# Patient Record
Sex: Male | Born: 1975 | Hispanic: No | Marital: Single | Smoking: Never smoker
Health system: Southern US, Community
[De-identification: ages and names within clinical notes are randomized; demographics above are authoritative.]

## PROBLEM LIST (undated history)

## (undated) DIAGNOSIS — G809 Cerebral palsy, unspecified: Secondary | ICD-10-CM

## (undated) DIAGNOSIS — H9190 Unspecified hearing loss, unspecified ear: Secondary | ICD-10-CM

## (undated) DIAGNOSIS — R569 Unspecified convulsions: Secondary | ICD-10-CM

## (undated) HISTORY — DX: Unspecified hearing loss, unspecified ear: H91.90

## (undated) HISTORY — DX: Unspecified convulsions: R56.9

## (undated) HISTORY — DX: Cerebral palsy, unspecified: G80.9

---

## 2002-07-15 ENCOUNTER — Emergency Department (HOSPITAL_COMMUNITY): Admission: EM | Admit: 2002-07-15 | Discharge: 2002-07-15 | Payer: Self-pay | Admitting: *Deleted

## 2005-05-20 ENCOUNTER — Emergency Department (HOSPITAL_COMMUNITY): Admission: EM | Admit: 2005-05-20 | Discharge: 2005-05-20 | Payer: Self-pay | Admitting: Emergency Medicine

## 2010-10-14 ENCOUNTER — Emergency Department (HOSPITAL_COMMUNITY): Admission: EM | Admit: 2010-10-14 | Discharge: 2010-10-14 | Payer: Self-pay | Admitting: Emergency Medicine

## 2013-03-30 ENCOUNTER — Ambulatory Visit (INDEPENDENT_AMBULATORY_CARE_PROVIDER_SITE_OTHER): Payer: BC Managed Care – PPO | Admitting: Nurse Practitioner

## 2013-03-30 ENCOUNTER — Encounter: Payer: Self-pay | Admitting: Nurse Practitioner

## 2013-03-30 VITALS — BP 116/75 | HR 62 | Ht 69.0 in | Wt 179.0 lb

## 2013-03-30 DIAGNOSIS — Z79899 Other long term (current) drug therapy: Secondary | ICD-10-CM | POA: Insufficient documentation

## 2013-03-30 DIAGNOSIS — G40209 Localization-related (focal) (partial) symptomatic epilepsy and epileptic syndromes with complex partial seizures, not intractable, without status epilepticus: Secondary | ICD-10-CM

## 2013-03-30 MED ORDER — CARBAMAZEPINE 200 MG PO TABS: 200.0000 mg | ORAL_TABLET | Freq: Three times a day (TID) | ORAL | Status: DC

## 2013-03-30 NOTE — Patient Instructions (Addendum)
Continue medication at current dose, will renew prescription today Get labs, CBC, CMP, carbamazepine level Followup yearly and when necessary

## 2013-03-30 NOTE — Progress Notes (Signed)
HPI:  Patient returns for a yearly followup. Last visit 03/31/2012 . History of complex partial seizure disorder. He has been followed by our practice since December of 1997. He has history of spells inconsistent staring that represents his complex partial seizure disorder. He is the product of a complicated second pregnancy and was born prematurely. 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 He is currently on carbamazepine 200 mg 3 times daily without side effects. He denies any feelings of being off balance or falls Date of last seizure 1997. No staring spells, confusion, sleep disturbances, lapses of time, headache and bowel and bladder incontinence.    ROS:  -  seizure  Physical Exam General: well developed, well nourished, seated, in no evident distress Head: head normocephalic and atraumatic. Oropharynx benign Neck: supple with no carotid or supraclavicular bruits Cardiovascular: regular rate and rhythm, no murmurs  Neurologic Exam Mental Status: Awake and fully alert. Follows one 2 and 3 step commands   Cranial Nerves:Pupils equal, briskly reactive to light. Extraocular movements full without nystagmus. Visual fields full to confrontation. Hearing intact and symmetric to finger snap. Facial sensation intact. Face, tongue, palate move normally and symmetrically. Neck flexion and extension normal.  Motor: Normal bulk and tone. Normal strength in all tested extremity muscles. Coordination: Rapid alternating movements normal in all extremities. Finger-to-nose and heel-to-shin performed accurately bilaterally. Gait and Station: Arises from chair without difficulty. Hemiparetic gait on the right no assistive device. Unsteady with tandem, able to walk on heels and toes.  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

## 2013-03-31 ENCOUNTER — Encounter: Payer: Self-pay | Admitting: Nurse Practitioner

## 2013-03-31 LAB — COMPREHENSIVE METABOLIC PANEL
ALT: 31 IU/L (ref 0–44)
AST: 27 IU/L (ref 0–40)
Albumin/Globulin Ratio: 1.7 (ref 1.1–2.5)
BUN/Creatinine Ratio: 17 (ref 8–19)
BUN: 21 mg/dL — ABNORMAL HIGH (ref 6–20)
CO2: 28 mmol/L (ref 19–28)
Globulin, Total: 2.7 g/dL (ref 1.5–4.5)
Potassium: 4.8 mmol/L (ref 3.5–5.2)
Sodium: 144 mmol/L (ref 134–144)

## 2013-03-31 LAB — CARBAMAZEPINE LEVEL, TOTAL: Carbamazepine Lvl: 6 ug/mL (ref 4.0–12.0)

## 2013-03-31 LAB — CBC WITH DIFFERENTIAL/PLATELET
Basophils Absolute: 0 10*3/uL (ref 0.0–0.2)
Basos: 0 % (ref 0–3)
Eos: 2 % (ref 0–5)
Eosinophils Absolute: 0.1 10*3/uL (ref 0.0–0.4)
Lymphs: 45 % (ref 14–46)
MCH: 29.3 pg (ref 26.6–33.0)
MCHC: 34.2 g/dL (ref 31.5–35.7)
MCV: 86 fL (ref 79–97)
RBC: 5.05 x10E6/uL (ref 4.14–5.80)
RDW: 12.9 % (ref 12.3–15.4)

## 2014-03-08 ENCOUNTER — Telehealth: Payer: Self-pay | Admitting: Nurse Practitioner

## 2014-03-08 MED ORDER — CARBAMAZEPINE 200 MG PO TABS: 200.0000 mg | ORAL_TABLET | Freq: Three times a day (TID) | ORAL | Status: DC

## 2014-03-08 NOTE — Telephone Encounter (Signed)
Pt called for his refill prescription on carbamazepine (EPITOL) 200 MG tablet. When calling back to confirm of refill complete please let first operator know that this has been called into pharmacy. Thanks

## 2014-03-08 NOTE — Telephone Encounter (Signed)
Rx was sent.  I called back.  The operator said there was no answer from the patient and they took the message Rx has been sent.

## 2014-03-13 ENCOUNTER — Other Ambulatory Visit: Payer: Self-pay | Admitting: Nurse Practitioner

## 2014-03-29 ENCOUNTER — Ambulatory Visit (INDEPENDENT_AMBULATORY_CARE_PROVIDER_SITE_OTHER): Payer: BC Managed Care – PPO | Admitting: Nurse Practitioner

## 2014-03-29 ENCOUNTER — Encounter (INDEPENDENT_AMBULATORY_CARE_PROVIDER_SITE_OTHER): Payer: Self-pay

## 2014-03-29 ENCOUNTER — Encounter: Payer: Self-pay | Admitting: Nurse Practitioner

## 2014-03-29 VITALS — BP 122/81 | HR 78 | Ht 69.0 in | Wt 149.0 lb

## 2014-03-29 DIAGNOSIS — H9190 Unspecified hearing loss, unspecified ear: Secondary | ICD-10-CM | POA: Insufficient documentation

## 2014-03-29 DIAGNOSIS — G40209 Localization-related (focal) (partial) symptomatic epilepsy and epileptic syndromes with complex partial seizures, not intractable, without status epilepticus: Secondary | ICD-10-CM

## 2014-03-29 DIAGNOSIS — Z79899 Other long term (current) drug therapy: Secondary | ICD-10-CM

## 2014-03-29 LAB — COMPREHENSIVE METABOLIC PANEL
A/G RATIO: 1.7 (ref 1.1–2.5)
ALBUMIN: 4.5 g/dL (ref 3.5–5.5)
ALK PHOS: 36 IU/L — AB (ref 39–117)
ALT: 30 IU/L (ref 0–44)
AST: 27 IU/L (ref 0–40)
BILIRUBIN TOTAL: 0.4 mg/dL (ref 0.0–1.2)
BUN/Creatinine Ratio: 19 (ref 8–19)
BUN: 22 mg/dL — ABNORMAL HIGH (ref 6–20)
CO2: 25 mmol/L (ref 18–29)
CREATININE: 1.17 mg/dL (ref 0.76–1.27)
Calcium: 9.4 mg/dL (ref 8.7–10.2)
Chloride: 105 mmol/L (ref 96–108)
GFR calc Af Amer: 93 mL/min/{1.73_m2} (ref >59)
GFR calc non Af Amer: 80 mL/min/{1.73_m2} (ref >59)
GLUCOSE: 103 mg/dL — AB (ref 65–99)
Globulin, Total: 2.6 g/dL (ref 1.5–4.5)
Potassium: 4.6 mmol/L (ref 3.5–5.2)
Sodium: 141 mmol/L (ref 134–144)
TOTAL PROTEIN: 7.1 g/dL (ref 6.0–8.5)

## 2014-03-29 LAB — CBC WITH DIFFERENTIAL/PLATELET
BASOS ABS: 0 10*3/uL (ref 0.0–0.2)
BASOS: 1 %
Eos: 2 %
Eosinophils Absolute: 0.1 10*3/uL (ref 0.0–0.4)
HCT: 39.4 % (ref 37.5–51.0)
Hemoglobin: 13.7 g/dL (ref 12.6–17.7)
LYMPHS ABS: 2.6 10*3/uL (ref 0.7–3.1)
Lymphs: 45 %
MCH: 29.5 pg (ref 26.6–33.0)
MCHC: 34.8 g/dL (ref 31.5–35.7)
MCV: 85 fL (ref 79–97)
MONOCYTES: 7 %
MONOS ABS: 0.4 10*3/uL (ref 0.1–0.9)
NEUTROS ABS: 2.7 10*3/uL (ref 1.4–7.0)
Neutrophils Relative %: 45 %
RBC: 4.65 x10E6/uL (ref 4.14–5.80)
RDW: 12.8 % (ref 12.3–15.4)
WBC: 5.7 10*3/uL (ref 3.4–10.8)

## 2014-03-29 LAB — CARBAMAZEPINE LEVEL, TOTAL: CARBAMAZEPINE LVL: 6.3 ug/mL (ref 4.0–12.0)

## 2014-03-29 MED ORDER — CARBAMAZEPINE 200 MG PO TABS: 200.0000 mg | ORAL_TABLET | Freq: Three times a day (TID) | ORAL | Status: DC

## 2014-03-29 NOTE — Patient Instructions (Signed)
Continue Carbamazepine at  current dose will refill We will check labs today along with level of the drug Followup yearly and prn

## 2014-03-29 NOTE — Progress Notes (Signed)
[facility]  PATIENT: Mitchell Jensen DOB: 1976/01/12   REASON FOR VISIT: follow up for seizure disorder   HISTORY OF PRESENT ILLNESS: Mitchell Jensen, 38 year old returns for follow up for seizure disorder, he  has been well-controlled on carbamazepine with last seizure occurring in 1997. 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 He is deaf and has an interpreter for signing purposes however she does  not understand some of the questions that I am  asking and she called for backup assistance. A second interpreter  showed up and the visit was completed. He denies headache, bowel or bladder incontinence, staring spells. He does complain with some confusion at times, he denies that he has any difficulty sleeping. He denies missing any doses of his carbamazepine. He does not have a primary care provider .He returns for reevaluation.  HISTORY: of complex partial seizure disorder. He has been followed by our practice since December of 1997. He has history of spells inconsistent staring that represents his complex partial seizure disorder. He is the product of a complicated second pregnancy and was born prematurely. 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 He is currently on carbamazepine 200 mg 3 times daily without side effects. He denies any feelings of being off balance or falls Date of last seizure 1997. No staring spells, confusion, sleep disturbances, lapses of time, headache and bowel and bladder incontinence.    REVIEW OF SYSTEMS: Full 14 system review of systems performed and notable only for those listed, all others are neg:  Constitutional: N/A  Cardiovascular: N/A    Ear/Nose/Throat: deaf Skin: N/A  Eyes: N/A  Respiratory: N/A  Gastroitestinal: N/A  Hematology/Lymphatic: N/A  Endocrine: N/A Musculoskeletal:N/A  Allergy/Immunology: N/A  Neurological: History of seizure disorder  Psychiatric: N/A   ALLERGIES: No Known Allergies  HOME MEDICATIONS: Outpatient Prescriptions Prior to Visit  Medication Sig Dispense Refill  . carbamazepine (EPITOL) 200 MG tablet Take 1 tablet (200 mg total) by mouth 3 (three) times daily.  90 tablet  0  . EPITOL 200 MG tablet TAKE ONE TABLET BY MOUTH THREE TIMES DAILY  90 tablet  0   No facility-administered medications prior to visit.    PAST MEDICAL HISTORY: Past Medical History  Diagnosis Date  . Seizures   . 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 Cerebral palsy DEAF since birth     PAST SURGICAL HISTORY: History reviewed. No pertinent past surgical history.  FAMILY HISTORY: Family History  Problem Relation Age of Onset  . Heart attack Father   . COPD Father     SOCIAL HISTORY: History   Social History  . Marital Status: Single    Spouse Name: N/A    Number of Children: 0  . 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 Smoking status: Never Smoker   . Smokeless tobacco: Never Used  . Alcohol Use: No  . Drug Use: No  . Sexual Activity: Not on file   Other Topics Concern  . 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   Patient lives at home alone.    Patient has a HS.    Patient is left handed.    Patient drinks caffeine every once in a while. And sometimes if he drinks caffeine while working.       PHYSICAL EXAM  Filed Vitals:   03/29/14 1004  BP: 122/81  Pulse: 78  Height: 5\' 9"  (1.753 m)  Weight: 149 lb (67.586 kg)   Body mass index is 21.99 kg/(m^2).  Generalized: Well developed, in no acute distress well groomed,  Head:  normocephalic and atraumatic,. Oropharynx benign  Neck: Supple, no carotid bruits  Cardiac: Regular rate rhythm, no murmur  Neurological examination   Mentation: Alert.   Follows all commands given  by an interpretor that signs,  patient is deaf.   Cranial nerve II-XII: Pupils were equal round reactive to light extraocular movements were full, visual field were full on confrontational test. Facial sensation and strength were normal. Patient DEAF.  Uvula tongue midline. head turning and shoulder shrug were normal and symmetric.Tongue protrusion into cheek strength was normal. Motor: normal bulk and tone, full strength in the BUE, BLE, mild decreased right grip strength  No focal weakness Sensory: normal and symmetric to light touch, pinprick, and  vibration  Coordination: finger-nose-finger, heel-to-shin bilaterally, no dysmetria Reflexes: Increased on the right as compared to the left plantar responses were flexor bilaterally. Gait and Station: Rising up from seated position without assistance, hemiparetic gait on the right without assistive device. Unsteady with tandem DIAGNOSTIC DATA (LABS, IMAGING, TESTING) - I reviewed patient records, labs, notes, testing and imaging myself where available.  Lab Results  Component Value Date   WBC 5.4 03/30/2013   HGB 14.8 03/30/2013   HCT 43.3 03/30/2013   MCV 86 03/30/2013      Component Value Date/Time   NA 144 03/30/2013 0951   K 4.8 03/30/2013 0951   CL 104 03/30/2013 0951   CO2 28 03/30/2013 0951   GLUCOSE 89 03/30/2013 0951   BUN 21* 03/30/2013 0951   CREATININE 1.26 03/30/2013 0951   CALCIUM 9.6 03/30/2013 0951   PROT 7.3 03/30/2013 0951   AST 27 03/30/2013 0951   ALT 31 03/30/2013 0951   ALKPHOS 40 03/30/2013 0951   BILITOT 0.5 03/30/2013 0951   GFRNONAA 74 03/30/2013 0951   GFRAA 85 03/30/2013 0951    ASSESSMENT AND PLAN  38 y.o. year old male  has a past medical history of Seizures; Cerebral hemorrhage at birth; and Cerebral palsy. here to  follow up.   Continue Carbamazepine at  current dose will refill We will check labs today along with level of the drug Followup yearly and prn Nilda RiggsNancy Carolyn Martin, [facility] CenterGNP, [facility]BC, APRN  [facility] [address], [address] [place], [place] [address] [phone]

## 2014-03-29 NOTE — Progress Notes (Signed)
I have read the note, and I agree with the clinical assessment and plan.  Charles K Willis   

## 2014-07-12 ENCOUNTER — Telehealth: Payer: Self-pay | Admitting: Nurse Practitioner

## 2014-07-12 NOTE — Telephone Encounter (Signed)
Called patient, left message with operator to have patient call back in regards to his parking pass.

## 2014-07-12 NOTE — Telephone Encounter (Signed)
Patient calling to state that he still has not received his form regarding parking pass, states that he will be on vacation all next week and would like a call back before the end of this week. Please return call to patient and advise.

## 2014-10-31 ENCOUNTER — Encounter: Payer: Self-pay | Admitting: Neurology

## 2015-03-03 ENCOUNTER — Inpatient Hospital Stay (HOSPITAL_COMMUNITY)
Admission: EM | Admit: 2015-03-03 | Discharge: 2015-03-06 | DRG: 392 | Disposition: A | Discharge status: Home / Self Care | Payer: BLUE CROSS/BLUE SHIELD | Attending: Internal Medicine | Admitting: Internal Medicine

## 2015-03-03 ENCOUNTER — Encounter (HOSPITAL_COMMUNITY): Payer: Self-pay | Admitting: Emergency Medicine

## 2015-03-03 DIAGNOSIS — A09 Infectious gastroenteritis and colitis, unspecified: Principal | ICD-10-CM | POA: Diagnosis present

## 2015-03-03 DIAGNOSIS — G40209 Localization-related (focal) (partial) symptomatic epilepsy and epileptic syndromes with complex partial seizures, not intractable, without status epilepticus: Secondary | ICD-10-CM | POA: Diagnosis present

## 2015-03-03 DIAGNOSIS — G40909 Epilepsy, unspecified, not intractable, without status epilepticus: Secondary | ICD-10-CM | POA: Diagnosis present

## 2015-03-03 DIAGNOSIS — K838 Other specified diseases of biliary tract: Secondary | ICD-10-CM | POA: Diagnosis present

## 2015-03-03 DIAGNOSIS — H9190 Unspecified hearing loss, unspecified ear: Secondary | ICD-10-CM

## 2015-03-03 DIAGNOSIS — K529 Noninfective gastroenteritis and colitis, unspecified: Secondary | ICD-10-CM | POA: Insufficient documentation

## 2015-03-03 DIAGNOSIS — R1084 Generalized abdominal pain: Secondary | ICD-10-CM | POA: Diagnosis not present

## 2015-03-03 DIAGNOSIS — G809 Cerebral palsy, unspecified: Secondary | ICD-10-CM | POA: Diagnosis present

## 2015-03-03 LAB — CBC WITH DIFFERENTIAL/PLATELET
Basophils Absolute: 0 10*3/uL (ref 0.0–0.1)
Basophils Relative: 0 % (ref 0–1)
EOS ABS: 0.1 10*3/uL (ref 0.0–0.7)
EOS PCT: 1 % (ref 0–5)
HCT: 40.4 % (ref 39.0–52.0)
Hemoglobin: 12.9 g/dL — ABNORMAL LOW (ref 13.0–17.0)
LYMPHS ABS: 1.7 10*3/uL (ref 0.7–4.0)
LYMPHS PCT: 19 % (ref 12–46)
MCH: 28.1 pg (ref 26.0–34.0)
MCHC: 31.9 g/dL (ref 30.0–36.0)
MCV: 88 fL (ref 78.0–100.0)
MONO ABS: 0.6 10*3/uL (ref 0.1–1.0)
Monocytes Relative: 7 % (ref 3–12)
NEUTROS ABS: 6.6 10*3/uL (ref 1.7–7.7)
NEUTROS PCT: 73 % (ref 43–77)
PLATELETS: 188 10*3/uL (ref 150–400)
RBC: 4.59 MIL/uL (ref 4.22–5.81)
RDW: 12.7 % (ref 11.5–15.5)
WBC: 9 10*3/uL (ref 4.0–10.5)

## 2015-03-03 MED ORDER — HYDROMORPHONE HCL 1 MG/ML IJ SOLN
1.0000 mg | Freq: Once | INTRAMUSCULAR | Status: AC
  Administered 2015-03-03: 1 mg via INTRAVENOUS
  Filled 2015-03-03: qty 1

## 2015-03-03 MED ORDER — SODIUM CHLORIDE 0.9 % IV BOLUS (SEPSIS)
1000.0000 mL | Freq: Once | INTRAVENOUS | Status: AC
  Administered 2015-03-03: 1000 mL via INTRAVENOUS

## 2015-03-03 MED ORDER — ONDANSETRON HCL 4 MG/2ML IJ SOLN
4.0000 mg | Freq: Once | INTRAMUSCULAR | Status: AC
  Administered 2015-03-03: 4 mg via INTRAVENOUS
  Filled 2015-03-03: qty 2
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
## 2015-03-03 NOTE — ED Notes (Signed)
Pt is deaf- sign lang. Interpreter has been contacted. Pt c/o abdominal pain with nvd x 2 hours.

## 2015-03-04 ENCOUNTER — Encounter (HOSPITAL_COMMUNITY): Payer: Self-pay | Admitting: Radiology

## 2015-03-04 ENCOUNTER — Emergency Department (HOSPITAL_COMMUNITY): Payer: BLUE CROSS/BLUE SHIELD

## 2015-03-04 DIAGNOSIS — G809 Cerebral palsy, unspecified: Secondary | ICD-10-CM | POA: Diagnosis present

## 2015-03-04 DIAGNOSIS — H9190 Unspecified hearing loss, unspecified ear: Secondary | ICD-10-CM

## 2015-03-04 DIAGNOSIS — G40909 Epilepsy, unspecified, not intractable, without status epilepticus: Secondary | ICD-10-CM | POA: Diagnosis present

## 2015-03-04 DIAGNOSIS — R1084 Generalized abdominal pain: Secondary | ICD-10-CM | POA: Diagnosis present

## 2015-03-04 DIAGNOSIS — K838 Other specified diseases of biliary tract: Secondary | ICD-10-CM | POA: Diagnosis present

## 2015-03-04 DIAGNOSIS — A09 Infectious gastroenteritis and colitis, unspecified: Secondary | ICD-10-CM | POA: Diagnosis present

## 2015-03-04 DIAGNOSIS — G40209 Localization-related (focal) (partial) symptomatic epilepsy and epileptic syndromes with complex partial seizures, not intractable, without status epilepticus: Secondary | ICD-10-CM | POA: Diagnosis not present

## 2015-03-04 LAB — URINALYSIS, ROUTINE W REFLEX MICROSCOPIC
Bilirubin Urine: NEGATIVE
Glucose, UA: 250 mg/dL — AB
Hgb urine dipstick: NEGATIVE
Ketones, ur: NEGATIVE mg/dL
LEUKOCYTES UA: NEGATIVE
NITRITE: NEGATIVE
PH: 5.5 (ref 5.0–8.0)
Protein, ur: 30 mg/dL — AB
SPECIFIC GRAVITY, URINE: 1.025 (ref 1.005–1.030)
Urobilinogen, UA: 0.2 mg/dL (ref 0.0–1.0)

## 2015-03-04 LAB — COMPREHENSIVE METABOLIC PANEL
ALK PHOS: 37 U/L — AB (ref 39–117)
ALT: 21 U/L (ref 0–53)
ALT: 21 U/L (ref 0–53)
ANION GAP: 6 (ref 5–15)
AST: 26 U/L (ref 0–37)
AST: 24 U/L (ref 0–37)
Albumin: 4.1 g/dL (ref 3.5–5.2)
Albumin: 3.6 g/dL (ref 3.5–5.2)
Alkaline Phosphatase: 40 U/L (ref 39–117)
Anion gap: 5 (ref 5–15)
BUN: 12 mg/dL (ref 6–23)
BUN: 10 mg/dL (ref 6–23)
CALCIUM: 9.3 mg/dL (ref 8.4–10.5)
CO2: 31 mmol/L (ref 19–32)
CO2: 28 mmol/L (ref 19–32)
CREATININE: 1.41 mg/dL — AB (ref 0.50–1.35)
CREATININE: 1.2 mg/dL (ref 0.50–1.35)
Calcium: 8.6 mg/dL (ref 8.4–10.5)
Chloride: 108 mmol/L (ref 96–112)
Chloride: 107 mmol/L (ref 96–112)
GFR calc non Af Amer: 63 mL/min — ABNORMAL LOW (ref >90)
GFR calc non Af Amer: 76 mL/min — ABNORMAL LOW (ref >90)
GFR, EST AFRICAN AMERICAN: 73 mL/min — AB (ref >90)
GFR, EST AFRICAN AMERICAN: 89 mL/min — AB (ref >90)
GLUCOSE: 103 mg/dL — AB (ref 70–99)
GLUCOSE: 122 mg/dL — AB (ref 70–99)
POTASSIUM: 4 mmol/L (ref 3.5–5.1)
Potassium: 3.7 mmol/L (ref 3.5–5.1)
SODIUM: 145 mmol/L (ref 135–145)
Sodium: 140 mmol/L (ref 135–145)
TOTAL PROTEIN: 7.2 g/dL (ref 6.0–8.3)
TOTAL PROTEIN: 6.5 g/dL (ref 6.0–8.3)
Total Bilirubin: 0.6 mg/dL (ref 0.3–1.2)
Total Bilirubin: 0.7 mg/dL (ref 0.3–1.2)

## 2015-03-04 LAB — CBC WITH DIFFERENTIAL/PLATELET
BASOS ABS: 0 10*3/uL (ref 0.0–0.1)
BASOS PCT: 0 % (ref 0–1)
EOS ABS: 0 10*3/uL (ref 0.0–0.7)
Eosinophils Relative: 0 % (ref 0–5)
HCT: 38.2 % — ABNORMAL LOW (ref 39.0–52.0)
HEMOGLOBIN: 12.2 g/dL — AB (ref 13.0–17.0)
Lymphocytes Relative: 16 % (ref 12–46)
Lymphs Abs: 1.4 10*3/uL (ref 0.7–4.0)
MCH: 28.4 pg (ref 26.0–34.0)
MCHC: 31.9 g/dL (ref 30.0–36.0)
MCV: 88.8 fL (ref 78.0–100.0)
Monocytes Absolute: 0.5 10*3/uL (ref 0.1–1.0)
Monocytes Relative: 6 % (ref 3–12)
NEUTROS PCT: 78 % — AB (ref 43–77)
Neutro Abs: 6.9 10*3/uL (ref 1.7–7.7)
Platelets: 174 10*3/uL (ref 150–400)
RBC: 4.3 MIL/uL (ref 4.22–5.81)
RDW: 12.9 % (ref 11.5–15.5)
WBC: 8.8 10*3/uL (ref 4.0–10.5)

## 2015-03-04 LAB — PROTIME-INR
INR: 1.02 (ref 0.00–1.49)
Prothrombin Time: 13.5 seconds (ref 11.6–15.2)

## 2015-03-04 LAB — LIPASE, BLOOD
Lipase: 28 U/L (ref 11–59)
Lipase: 29 U/L (ref 11–59)

## 2015-03-04 LAB — I-STAT CG4 LACTIC ACID, ED: LACTIC ACID, VENOUS: 2.04 mmol/L — AB (ref 0.5–2.0)

## 2015-03-04 LAB — URINE MICROSCOPIC-ADD ON

## 2015-03-04 LAB — CLOSTRIDIUM DIFFICILE BY PCR: Toxigenic C. Difficile by PCR: NEGATIVE

## 2015-03-04 LAB — LACTIC ACID, PLASMA
Lactic Acid, Venous: 1.5 mmol/L (ref 0.5–2.0)
Lactic Acid, Venous: 1.4 mmol/L (ref 0.5–2.0)

## 2015-03-04 MED ORDER — ONDANSETRON HCL 4 MG/2ML IJ SOLN
4.0000 mg | Freq: Four times a day (QID) | INTRAMUSCULAR | Status: DC | PRN
  Administered 2015-03-04: 4 mg via INTRAVENOUS
  Filled 2015-03-04: qty 2

## 2015-03-04 MED ORDER — IOHEXOL 300 MG/ML  SOLN: 25.0000 mL | INTRAMUSCULAR | Status: AC

## 2015-03-04 MED ORDER — PROMETHAZINE HCL 25 MG/ML IJ SOLN: 25.0000 mg | Freq: Four times a day (QID) | INTRAMUSCULAR | Status: DC | PRN

## 2015-03-04 MED ORDER — HEPARIN SODIUM (PORCINE) 5000 UNIT/ML IJ SOLN
5000.0000 [IU] | Freq: Three times a day (TID) | INTRAMUSCULAR | Status: DC
  Administered 2015-03-04 – 2015-03-06 (×7): 5000 [IU] via SUBCUTANEOUS
  Filled 2015-03-04 (×7): qty 1

## 2015-03-04 MED ORDER — PIPERACILLIN-TAZOBACTAM 3.375 G IVPB 30 MIN
3.3750 g | Freq: Once | INTRAVENOUS | Status: AC
  Administered 2015-03-04: 3.375 g via INTRAVENOUS
  Filled 2015-03-04 (×2): qty 50

## 2015-03-04 MED ORDER — PANTOPRAZOLE SODIUM 40 MG IV SOLR
40.0000 mg | Freq: Two times a day (BID) | INTRAVENOUS | Status: DC
  Administered 2015-03-04 – 2015-03-05 (×3): 40 mg via INTRAVENOUS
  Filled 2015-03-04 (×4): qty 40

## 2015-03-04 MED ORDER — ONDANSETRON HCL 4 MG PO TABS: 4.0000 mg | ORAL_TABLET | Freq: Four times a day (QID) | ORAL | Status: DC | PRN

## 2015-03-04 MED ORDER — PIPERACILLIN-TAZOBACTAM 3.375 G IVPB
3.3750 g | Freq: Three times a day (TID) | INTRAVENOUS | Status: DC
  Administered 2015-03-04 – 2015-03-05 (×3): 3.375 g via INTRAVENOUS
  Filled 2015-03-04 (×5): qty 50

## 2015-03-04 MED ORDER — MORPHINE SULFATE 2 MG/ML IJ SOLN
1.0000 mg | INTRAMUSCULAR | Status: DC | PRN
  Administered 2015-03-05: 2 mg via INTRAVENOUS
  Filled 2015-03-04: qty 1

## 2015-03-04 MED ORDER — MORPHINE SULFATE 2 MG/ML IJ SOLN: 2.0000 mg | INTRAMUSCULAR | Status: DC | PRN

## 2015-03-04 MED ORDER — PROMETHAZINE HCL 25 MG/ML IJ SOLN
25.0000 mg | Freq: Once | INTRAMUSCULAR | Status: AC
  Administered 2015-03-04: 25 mg via INTRAVENOUS
  Filled 2015-03-04: qty 1

## 2015-03-04 MED ORDER — METRONIDAZOLE IN NACL 5-0.79 MG/ML-% IV SOLN
500.0000 mg | Freq: Three times a day (TID) | INTRAVENOUS | Status: DC
  Administered 2015-03-04 – 2015-03-05 (×4): 500 mg via INTRAVENOUS
  Filled 2015-03-04 (×5): qty 100

## 2015-03-04 MED ORDER — PIPERACILLIN-TAZOBACTAM 3.375 G IVPB 30 MIN
3.3750 g | Freq: Once | INTRAVENOUS | Status: DC
  Filled 2015-03-04: qty 50

## 2015-03-04 MED ORDER — IOHEXOL 300 MG/ML  SOLN: 100.0000 mL | Freq: Once | INTRAMUSCULAR | Status: AC | PRN

## 2015-03-04 MED ORDER — CARBAMAZEPINE 200 MG PO TABS
200.0000 mg | ORAL_TABLET | Freq: Three times a day (TID) | ORAL | Status: DC
  Administered 2015-03-04 – 2015-03-06 (×7): 200 mg via ORAL
  Filled 2015-03-04 (×8): qty 1

## 2015-03-04 MED ORDER — SODIUM CHLORIDE 0.9 % IV SOLN
INTRAVENOUS | Status: DC
  Administered 2015-03-04 (×2): via INTRAVENOUS

## 2015-03-04 MED ORDER — SODIUM CHLORIDE 0.9 % IJ SOLN
3.0000 mL | Freq: Two times a day (BID) | INTRAMUSCULAR | Status: DC
  Administered 2015-03-04: 3 mL via INTRAVENOUS

## 2015-03-04 MED ORDER — LORAZEPAM 2 MG/ML IJ SOLN: 1.0000 mg | INTRAMUSCULAR | Status: DC | PRN

## 2015-03-04 MED ORDER — HYDROMORPHONE HCL 1 MG/ML IJ SOLN
1.0000 mg | Freq: Once | INTRAMUSCULAR | Status: AC
  Administered 2015-03-04: 1 mg via INTRAVENOUS
  Filled 2015-03-04: qty 1

## 2015-03-04 MED ORDER — ONDANSETRON HCL 4 MG/2ML IJ SOLN
4.0000 mg | Freq: Once | INTRAMUSCULAR | Status: AC
  Administered 2015-03-04: 4 mg via INTRAVENOUS
  Filled 2015-03-04: qty 2
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
## 2015-03-04 NOTE — ED Provider Notes (Signed)
0100 - Care from Dr. Madilyn Hookees. Patient awaiting CT for vomiting and abdominal pain. CT had emergent results portal venous gas in the L hepatic lobe. Radiologist was able to see air in other veins tracking back towards the stomach. I spoke to Dr. Lindie SpruceWyatt with General Surgery about this, he stated without signs of infection there was no immediate surgical need. Patient admitted to medicine.  1. Generalized abdominal pain      Mitchell MochaBlair Issaic Welliver, MD 03/04/15 [phone]
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
## 2015-03-04 NOTE — ED Notes (Signed)
Dr. Madilyn Hookees instructed that pt does not need to take oral contrast, CT tech notified.

## 2015-03-04 NOTE — Progress Notes (Signed)
39yo male c/o vomiting and abdominal pain, CT shows portal venous gas in L hpatic lobe tracking back toward stomach, concern for intra-abdominal infection, to begin IV ABX.  Will begin Zosyn 3.375g IV Q8H for CrCl ~70 ml/min and monitor CBC and Cx.  Vernard GamblesVeronda Isha Seefeld, PharmD, BCPS 03/04/2015 6:46 AM

## 2015-03-04 NOTE — Progress Notes (Signed)
Pt is more alert this evening and states that he is feeling better. Pt was able to eat 100% of his clear liquid tray for dinner. Still having small amounts of loose stools. No vomiting and nausea has decreased. Will continue to monitor
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
## 2015-03-04 NOTE — ED Notes (Signed)
Dr Preston FleetingGlick given a copy of lactic acid results 2.04

## 2015-03-05 DIAGNOSIS — A09 Infectious gastroenteritis and colitis, unspecified: Principal | ICD-10-CM

## 2015-03-05 DIAGNOSIS — K529 Noninfective gastroenteritis and colitis, unspecified: Secondary | ICD-10-CM | POA: Insufficient documentation

## 2015-03-05 LAB — PROLACTIN: Prolactin: 17.4 ng/mL — ABNORMAL HIGH (ref 4.0–15.2)

## 2015-03-05 LAB — CARBAMAZEPINE, FREE AND TOTAL
Carbamazepine, Free: 1.4 ug/mL (ref 0.6–4.2)
Carbamazepine, Total: 4.9 ug/mL (ref 4.0–12.0)

## 2015-03-05 MED ORDER — PANTOPRAZOLE SODIUM 40 MG PO TBEC
40.0000 mg | DELAYED_RELEASE_TABLET | Freq: Every day | ORAL | Status: DC
  Administered 2015-03-06: 40 mg via ORAL
  Filled 2015-03-05: qty 1

## 2015-03-05 NOTE — Progress Notes (Signed)
Daily Rounding Note  03/05/2015, 11:33 AM  LOS: 1 day   SUBJECTIVE:       Diet advanced from clears to soft this AM.  Diarrhea improved: less frequent, less volume but still loose stools.  Still some nausea and abdominal pain, primarily left UQ > right UQ but this also improved.   OBJECTIVE:         Vital signs in last 24 hours:    Temp:  [98.2 F (36.8 C)-100 F (37.8 C)] 98.9 F (37.2 C) (03/29 0635) Pulse Rate:  [60-89] 60 (03/29 0635) Resp:  [16-18] 16 (03/29 0635) BP: (141-170)/(92-99) 165/97 mmHg (03/29 0635) SpO2:  [98 %-100 %] 100 % (03/29 0635) Weight:  [153 lb 3.5 oz (69.5 kg)] 153 lb 3.5 oz (69.5 kg) (03/29 0500) Last BM Date: 03/04/15 Filed Weights   03/03/15 2233 03/04/15 0703 03/05/15 0500  Weight: 150 lb (68.04 kg) 149 lb 7.6 oz (67.8 kg) 153 lb 3.5 oz (69.5 kg)   General: looks well, comfortable   Heart: RRR Chest: clear bil.  No labored resps or cough.  Abdomen: soft, NT, ND, active BS.  Last Morphine given at 1116, forty minutes ago.  Extremities: no CCE Neuro/Psych:  Deaf, alert.  Communication completed with mom acting as sign interpreter.   Intake/Output from previous day: 03/28 0701 - 03/29 0700 In: 240 [P.O.:240] Out: 300 [Urine:300]  Intake/Output this shift:    Lab Results:  Recent Labs  03/03/15 2240 03/04/15 0659  WBC 9.0 8.8  HGB 12.9* 12.2*  HCT 40.4 38.2*  PLT 188 174   BMET  Recent Labs  03/03/15 2240 03/04/15 0659  NA 145 140  K 3.7 4.0  CL 108 107  CO2 31 28  GLUCOSE 103* 122*  BUN 12 10  CREATININE 1.41* 1.20  CALCIUM 9.3 8.6   LFT  Recent Labs  03/03/15 2240 03/04/15 0659  PROT 7.2 6.5  ALBUMIN 4.1 3.6  AST 26 24  ALT 21 21  ALKPHOS 40 37*  BILITOT 0.6 0.7   PT/INR  Recent Labs  03/04/15 0659  LABPROT 13.5  INR 1.02   Hepatitis Panel No results for input(s): HEPBSAG, HCVAB, HEPAIGM, HEPBIGM in the last 72 hours.  Studies/Results: Ct  Abdomen Pelvis W Contrast  03/04/2015   CLINICAL DATA:  Initial valuation for acute abdominal pain, nausea, vomiting, diarrhea for 5 hours. History of cerebral palsy.  EXAM: CT ABDOMEN AND PELVIS WITH CONTRAST  TECHNIQUE: Multidetector CT imaging of the abdomen and pelvis was performed using the standard protocol following bolus administration of intravenous contrast.  CONTRAST:  100 cc of Omni 300.  COMPARISON:  None.  FINDINGS: Dependent atelectasis present within the visualized lung bases. No pleural or pericardial effusion.  The liver itself demonstrates a normal contrast enhanced appearance. There is extensive portal venous gas within the liver, predominantly involving the left hepatic lobe. This is seen tracking within several extrahepatic veins, most prominently around the stomach. Finding consistent with gastric emphysema with secondary portal venous gas. No frank free air to suggest perforation from a a gastric ulcer. This can be due to ischemia, vomiting, or possibly infection.  Gallbladder within normal limits. No biliary dilatation. Spleen, adrenal glands, and pancreas demonstrate a normal contrast enhanced appearance.  Kidneys are equal in size with symmetric enhancement. No nephrolithiasis, hydronephrosis, or focal enhancing renal mass.  As mentioned above, the stomach is moderately distended with scattered gas within the surrounding gastric veins. Small bowel  is mildly prominent measuring up to 3 cm with a few scattered air-fluid levels. This is seen this smoothly taper the level of the terminal ileum without definite transition point. Fluid present within the colon. Finding may be related to underlying active diarrheal illness. Appendix within normal limits.  Bladder within normal limits.  Prostate normal.  No free air identified. No free fluid. Normal intravascular enhancement seen within the abdomen and pelvis.  No acute osseous abnormality. No worrisome lytic or blastic osseous lesions.   IMPRESSION: 1. Portal venous gas predominantly involving the left hepatic lobe. This is favored to arise from the stomach, with gas seen within multiple veins tracking back towards and around the stomach. Finding is of uncertain etiology, with differential considerations for gastric emphysema including ischemia or infection. This finding could also be seen with vomiting and recent manipulation (NG tube placement). No frank free intraperitoneal air to suggest perforated gastric ulcer. 2. Diffusely fluid filled stomach and colon, suggestive of underlying diarrheal illness. The small bowel is mildly prominent, but is seen to smoothly taper to the level of the terminal ileum without frank evidence of obstruction. Critical Value/emergent results were called by telephone at the time of interpretation on 03/04/2015 at 3:04 am to Dr. Elwin MochaBlair Walden, who verbally acknowledged these results.   Electronically Signed   By: Rise MuBenjamin  McClintock M.D.   On: 03/04/2015 03:12    Scheduled Meds: . carbamazepine  200 mg Oral TID  . heparin  5,000 Units Subcutaneous 3 times per day  . pantoprazole (PROTONIX) IV  40 mg Intravenous Q12H  . piperacillin-tazobactam (ZOSYN)  IV  3.375 g Intravenous Q8H  . sodium chloride  3 mL Intravenous Q12H   Continuous Infusions: . sodium chloride 100 mL/hr at 03/04/15 2153   PRN Meds:.LORazepam, morphine injection, ondansetron **OR** ondansetron (ZOFRAN) IV.   ASSESMENT:   *  Acute gastroenteritis with gas in left portal system.       PLAN   *  Will discontinue the Zosyn as the gastroenteritis is likely viral.  Switch to po Protonix.     Jennye MoccasinSarah Gribbin  03/05/2015, 11:33 AM Pager: [phone]  [facility] Attending  I have also seen and assessed the patient and agree with the advanced practitioner's assessment and plan. He is clearly improving. Continue supportive care. ? Home tomorrow. F/u PCP See GI only prn Does not need PPI at dc unless on before admit.  Signing  off  Iva Booparl E. Gessner, MD, Antionette FairyFACG [facility] [phone] (pager) 03/05/2015 1:57 PM
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
## 2015-03-05 NOTE — Progress Notes (Signed)
ANTIBIOTIC CONSULT NOTE - FOLLOW UP  Pharmacy Consult for Zosyn Indication: GI infection  No Known Allergies  Patient Measurements: Height: 5\' 9"  (175.3 cm) Weight: 153 lb 3.5 oz (69.5 kg) IBW/kg (Calculated) : 70.7 Adjusted Body Weight:   Vital Signs: Temp: 98.9 F (37.2 C) (03/29 0635) BP: 165/97 mmHg (03/29 0635) Pulse Rate: 60 (03/29 0635) Intake/Output from previous day: 03/28 0701 - 03/29 0700 In: 240 [P.O.:240] Out: 300 [Urine:300] Intake/Output from this shift: Total I/O In: 480 [P.O.:480] Out: -   Labs:  Recent Labs  03/03/15 2240 03/04/15 0659  WBC 9.0 8.8  HGB 12.9* 12.2*  PLT 188 174  CREATININE 1.41* 1.20   Estimated Creatinine Clearance: 83.7 mL/min (by C-G formula based on Cr of 1.2). No results for input(s): VANCOTROUGH, VANCOPEAK, VANCORANDOM, GENTTROUGH, GENTPEAK, GENTRANDOM, TOBRATROUGH, TOBRAPEAK, TOBRARND, AMIKACINPEAK, AMIKACINTROU, AMIKACIN in the last 72 hours.   Microbiology: Recent Results (from the past 720 hour(s))  Clostridium Difficile by PCR     Status: None   Collection Time: 03/04/15  9:44 AM  Result Value Ref Range Status   C difficile by pcr NEGATIVE NEGATIVE Final    Anti-infectives    Start     Dose/Rate Route Frequency Ordered Stop   03/04/15 1400  piperacillin-tazobactam (ZOSYN) IVPB 3.375 g  Status:  Discontinued     3.375 g 12.5 mL/hr over 240 Minutes Intravenous Every 8 hours 03/04/15 0645 03/05/15 1136   03/04/15 0730  metroNIDAZOLE (FLAGYL) IVPB 500 mg  Status:  Discontinued     500 mg 100 mL/hr over 60 Minutes Intravenous Every 8 hours 03/04/15 0630 03/05/15 1011   03/04/15 0700  piperacillin-tazobactam (ZOSYN) IVPB 3.375 g     3.375 g 100 mL/hr over 30 Minutes Intravenous  Once 03/04/15 0645 03/04/15 1145   03/04/15 0345  piperacillin-tazobactam (ZOSYN) IVPB 3.375 g  Status:  Discontinued     3.375 g 100 mL/hr over 30 Minutes Intravenous  Once 03/04/15 0340 03/04/15 0358      Assessment: 38yo male c/o  vomiting and abdominal pain, CT shows portal venous gas in L hpatic lobe tracking back toward stomach, concern for intra-abdominal infection, to begin IV ABX.  Goal of Therapy:  Eradication of infection  Plan:  Zosyn 3.375g IV q8hr--dose ok down to CrCl 20. Pharmacy will sign off. Please reconsult for further dosing assitance.    Mitchell Jensen, PharmD, BCPS Clinical Staff Pharmacist Pager [phone]  Mitchell Jensen, Mitchell Jensen 03/05/2015,1:26 PM

## 2015-03-06 LAB — COMPREHENSIVE METABOLIC PANEL
ALBUMIN: 3.1 g/dL — AB (ref 3.5–5.2)
ALT: 21 U/L (ref 0–53)
AST: 25 U/L (ref 0–37)
Alkaline Phosphatase: 28 U/L — ABNORMAL LOW (ref 39–117)
Anion gap: 4 — ABNORMAL LOW (ref 5–15)
BUN: 5 mg/dL — ABNORMAL LOW (ref 6–23)
CALCIUM: 8.3 mg/dL — AB (ref 8.4–10.5)
CO2: 30 mmol/L (ref 19–32)
Chloride: 108 mmol/L (ref 96–112)
Creatinine, Ser: 1.18 mg/dL (ref 0.50–1.35)
GFR calc Af Amer: >90 mL/min (ref >90)
GFR, EST NON AFRICAN AMERICAN: 78 mL/min — AB (ref >90)
GLUCOSE: 95 mg/dL (ref 70–99)
Potassium: 3.2 mmol/L — ABNORMAL LOW (ref 3.5–5.1)
Sodium: 142 mmol/L (ref 135–145)
Total Bilirubin: 0.6 mg/dL (ref 0.3–1.2)
Total Protein: 5.6 g/dL — ABNORMAL LOW (ref 6.0–8.3)

## 2015-03-06 LAB — CBC
HEMATOCRIT: 33.7 % — AB (ref 39.0–52.0)
Hemoglobin: 11.1 g/dL — ABNORMAL LOW (ref 13.0–17.0)
MCH: 28.5 pg (ref 26.0–34.0)
MCHC: 32.9 g/dL (ref 30.0–36.0)
MCV: 86.6 fL (ref 78.0–100.0)
Platelets: 147 10*3/uL — ABNORMAL LOW (ref 150–400)
RBC: 3.89 MIL/uL — ABNORMAL LOW (ref 4.22–5.81)
RDW: 12.3 % (ref 11.5–15.5)
WBC: 5 10*3/uL (ref 4.0–10.5)

## 2015-03-06 MED ORDER — TRAMADOL HCL ER 100 MG PO TB24: 100.0000 mg | ORAL_TABLET | Freq: Two times a day (BID) | ORAL | Status: DC | PRN

## 2015-03-06 MED ORDER — ONDANSETRON HCL 4 MG PO TABS: 4.0000 mg | ORAL_TABLET | Freq: Four times a day (QID) | ORAL | Status: DC | PRN

## 2015-03-06 MED ORDER — POTASSIUM CHLORIDE CRYS ER 20 MEQ PO TBCR: 40.0000 meq | EXTENDED_RELEASE_TABLET | Freq: Once | ORAL | Status: DC

## 2015-03-06 MED ORDER — LOPERAMIDE HCL 2 MG PO TABS: 2.0000 mg | ORAL_TABLET | Freq: Four times a day (QID) | ORAL | Status: DC | PRN

## 2015-03-06 MED ORDER — PANTOPRAZOLE SODIUM 40 MG PO TBEC: 40.0000 mg | DELAYED_RELEASE_TABLET | Freq: Every day | ORAL | Status: DC

## 2015-03-06 MED ORDER — POTASSIUM CHLORIDE CRYS ER 20 MEQ PO TBCR: 20.0000 meq | EXTENDED_RELEASE_TABLET | Freq: Once | ORAL | Status: DC

## 2015-03-06 NOTE — Discharge Instructions (Addendum)
Patient hospitalized between 3/27 -3/30  Patient can go back to work 4/4, after PCP follow-up
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
## 2015-03-25 ENCOUNTER — Telehealth: Payer: Self-pay | Admitting: Nurse Practitioner

## 2015-03-25 ENCOUNTER — Telehealth: Payer: Self-pay | Admitting: *Deleted

## 2015-03-25 NOTE — Telephone Encounter (Signed)
Patient is calling as he needs a LOA form and a Healthcare form signed by doctor [facility][phone].  Patient's caregiver will bring forms by.  Please call when ready for pickup.  Thanks!

## 2015-03-25 NOTE — Telephone Encounter (Signed)
Form,[employer]  [place] to [place] 03-25-15.

## 2015-03-29 NOTE — Telephone Encounter (Signed)
Form is in CM's form folder. Will go over form in upcoming visit on 04/01/15.

## 2015-04-01 ENCOUNTER — Ambulatory Visit (INDEPENDENT_AMBULATORY_CARE_PROVIDER_SITE_OTHER): Payer: BLUE CROSS/BLUE SHIELD | Admitting: Nurse Practitioner

## 2015-04-01 ENCOUNTER — Ambulatory Visit: Payer: BC Managed Care – PPO | Admitting: Nurse Practitioner

## 2015-04-01 ENCOUNTER — Encounter: Payer: Self-pay | Admitting: Nurse Practitioner

## 2015-04-01 VITALS — BP 124/83 | HR 74 | Ht 69.0 in | Wt 149.0 lb

## 2015-04-01 DIAGNOSIS — Z5181 Encounter for therapeutic drug level monitoring: Secondary | ICD-10-CM | POA: Diagnosis not present

## 2015-04-01 DIAGNOSIS — H9190 Unspecified hearing loss, unspecified ear: Secondary | ICD-10-CM

## 2015-04-01 DIAGNOSIS — Z0289 Encounter for other administrative examinations: Secondary | ICD-10-CM

## 2015-04-01 DIAGNOSIS — G40209 Localization-related (focal) (partial) symptomatic epilepsy and epileptic syndromes with complex partial seizures, not intractable, without status epilepticus: Secondary | ICD-10-CM | POA: Diagnosis not present

## 2015-04-01 NOTE — Progress Notes (Signed)
[facility]  PATIENT: Mitchell Jensen DOB: January 26, 1976   REASON FOR VISIT: Follow-up for complex partial seizure disorder HISTORY FROM: Patient via interpreter    HISTORY OF PRESENT ILLNESS:Mitchell Jensen, 39 year old returns for follow up for seizure disorder, he has been well-controlled on carbamazepine with last seizure occurring in 1997. 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 He is deaf and has an interpreter for signing purposes He denies headache, bowel or bladder incontinence, staring spells.He denies that he has any difficulty sleeping. He denies missing any doses of his carbamazepine. He does not have a primary care provider .He returns for reevaluation.  HISTORY: of complex partial seizure disorder. He has been followed by our practice since December of 1997. He has history of spells inconsistent staring that represents his complex partial seizure disorder. He is the product of a complicated second pregnancy and was born prematurely. 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 He is currently on carbamazepine 200 mg 3 times daily without side effects. He denies any feelings of being off balance or falls Date of last seizure 1997. No staring spells, confusion, sleep disturbances, lapses of time, headache and bowel and bladder incontinence.   REVIEW OF SYSTEMS: Full 14 system review of systems performed and notable only for those listed, all others are neg:  Constitutional: neg  Cardiovascular: neg Ear/Nose/Throat: neg  Skin: neg Eyes: neg Respiratory: neg Gastroitestinal: neg  Hematology/Lymphatic: neg  Endocrine: neg Musculoskeletal:neg Allergy/Immunology:  neg Neurological: History of seizure disorder Psychiatric: neg Sleep : neg   ALLERGIES: No Known Allergies  HOME MEDICATIONS: Outpatient Prescriptions Prior to Visit  Medication Sig Dispense Refill  . carbamazepine (EPITOL) 200 MG tablet Take 1 tablet (200 mg total) by mouth 3 (three) times daily. 90 tablet 11  . loperamide (IMODIUM A-D) 2 MG tablet Take 1 tablet (2 mg total) by mouth 4 (four) times daily as needed for diarrhea or loose stools. 30 tablet 0  . ondansetron (ZOFRAN) 4 MG tablet Take 1 tablet (4 mg total) by mouth every 6 (six) hours as needed for nausea. 30 tablet 0  . pantoprazole (PROTONIX) 40 MG tablet Take 1 tablet (40 mg total) by mouth daily at 6 (six) AM. 30 tablet 1  . potassium chloride SA (K-DUR,KLOR-CON) 20 MEQ tablet Take 1 tablet (20 mEq total) by mouth once. 5 tablet 0  . traMADol (ULTRAM-ER) 100 MG 24 hr tablet Take 1 tablet (100 mg total) by mouth 2 (two) times daily as needed for pain. 30 tablet 1   No facility-administered medications prior to visit.    PAST MEDICAL HISTORY: Past Medical History  Diagnosis Date  . Seizures   . 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 Cerebral palsy   . Deaf     PAST SURGICAL HISTORY: History reviewed. No pertinent past surgical history.  FAMILY HISTORY: Family History  Problem Relation Age of Onset  . Heart attack Father   . COPD Father     SOCIAL HISTORY: History   Social History  . Marital Status: Single    Spouse Name: N/A  . Number of Children: 0  . Years of Education: HS   Occupational History  . Medical illustrator[employer]  Social History Main Topics  . Smoking status: Never Smoker   . Smokeless tobacco: Never Used  . Alcohol Use: No  . Drug Use: No  . Sexual Activity: Not on file   Other Topics Concern  . 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   Patient lives at home alone.    Patient has a HS.     Patient is left handed.    Patient drinks caffeine every once in a while. And sometimes if he drinks caffeine while working.       PHYSICAL EXAM  Filed Vitals:   04/01/15 0937  BP: 124/83  Pulse: 74  Height:  (1.753 m)  Weight: 149 lb (67.586 kg)   Body mass index is 21.99 kg/(m^2). Generalized: Well developed, in no acute distress well groomed,  Head: normocephalic and atraumatic,. Oropharynx benign  Neck: Supple, no carotid bruits  Cardiac: Regular rate rhythm, no murmur  Neurological examination   Mentation: Alert. Follows all commands given by an interpreter that signs, patient is deaf.   Cranial nerve II-XII: Pupils were equal round reactive to light extraocular movements were full, visual field were full on confrontational test. Facial sensation and strength were normal. Patient DEAF. Uvula tongue midline. head turning and shoulder shrug were normal and symmetric.Tongue protrusion into cheek strength was normal. Motor: normal bulk and tone, full strength in the BUE, BLE, mild decreased right grip strength No focal weakness Sensory: normal and symmetric to light touch, pinprick, and vibration  Coordination: finger-nose-finger, heel-to-shin bilaterally, no dysmetria Reflexes: Increased on the right as compared to the left plantar responses were flexor bilaterally. Gait and Station: Rising up from seated position without assistance, hemiparetic gait on the right without assistive device. Unsteady with tandem    DIAGNOSTIC DATA (LABS, IMAGING, TESTING) -  ASSESSMENT AND PLAN  39 y.o. 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 here to follow-up.  Will obtain labs today CBC, CMP  along with carbamazepine level Continue carbamazepine at current dose will refill once labs are back Will fill out DMV form once labs are back Call for any seizure activity Follow-up yearly and when  necessary Vst time 19 min Nilda Riggs, [facility], [facility], APRN  [facility] [address], [address] [place], [place] [address] [phone]

## 2015-04-01 NOTE — Patient Instructions (Signed)
Will obtain labs today along with carbamazepine level Will fill out DMV form once labs are back Call for any seizure activity Follow-up yearly and when necessary

## 2015-04-01 NOTE — Progress Notes (Signed)
I have read the note, and I agree with the clinical assessment and plan.  WILLIS,CHARLES KEITH   

## 2015-04-02 ENCOUNTER — Encounter: Payer: Self-pay | Admitting: Nurse Practitioner

## 2015-04-02 ENCOUNTER — Other Ambulatory Visit: Payer: Self-pay | Admitting: Nurse Practitioner

## 2015-04-02 LAB — COMPREHENSIVE METABOLIC PANEL
ALBUMIN: 4.3 g/dL (ref 3.5–5.5)
ALT: 20 IU/L (ref 0–44)
AST: 19 IU/L (ref 0–40)
Albumin/Globulin Ratio: 1.6 (ref 1.1–2.5)
Alkaline Phosphatase: 43 IU/L (ref 39–117)
BUN / CREAT RATIO: 12 (ref 8–19)
BUN: 13 mg/dL (ref 6–20)
Bilirubin Total: 0.4 mg/dL (ref 0.0–1.2)
CALCIUM: 9.3 mg/dL (ref 8.7–10.2)
CO2: 27 mmol/L (ref 18–29)
CREATININE: 1.06 mg/dL (ref 0.76–1.27)
Chloride: 104 mmol/L (ref 97–108)
GFR calc Af Amer: 104 mL/min/{1.73_m2} (ref >59)
GFR calc non Af Amer: 90 mL/min/{1.73_m2} (ref >59)
Globulin, Total: 2.7 g/dL (ref 1.5–4.5)
Glucose: 57 mg/dL — ABNORMAL LOW (ref 65–99)
Potassium: 3.9 mmol/L (ref 3.5–5.2)
Sodium: 144 mmol/L (ref 134–144)
Total Protein: 7 g/dL (ref 6.0–8.5)

## 2015-04-02 LAB — CBC WITH DIFFERENTIAL/PLATELET
Basophils Absolute: 0 10*3/uL (ref 0.0–0.2)
Basos: 0 %
EOS (ABSOLUTE): 0.1 10*3/uL (ref 0.0–0.4)
Eos: 3 %
HEMATOCRIT: 39 % (ref 37.5–51.0)
Hemoglobin: 12.9 g/dL (ref 12.6–17.7)
IMMATURE GRANS (ABS): 0 10*3/uL (ref 0.0–0.1)
IMMATURE GRANULOCYTES: 0 %
LYMPHS ABS: 1.9 10*3/uL (ref 0.7–3.1)
LYMPHS: 40 %
MCH: 28.9 pg (ref 26.6–33.0)
MCHC: 33.1 g/dL (ref 31.5–35.7)
MCV: 87 fL (ref 79–97)
MONOCYTES: 8 %
Monocytes Absolute: 0.4 10*3/uL (ref 0.1–0.9)
Neutrophils Absolute: 2.4 10*3/uL (ref 1.4–7.0)
Neutrophils: 49 %
Platelets: 200 10*3/uL (ref 150–379)
RBC: 4.47 x10E6/uL (ref 4.14–5.80)
RDW: 14.1 % (ref 12.3–15.4)
WBC: 4.9 10*3/uL (ref 3.4–10.8)

## 2015-04-02 LAB — CARBAMAZEPINE LEVEL, TOTAL: Carbamazepine Lvl: 6.6 ug/mL (ref 4.0–12.0)

## 2015-04-02 MED ORDER — CARBAMAZEPINE 200 MG PO TABS: 200.0000 mg | ORAL_TABLET | Freq: Three times a day (TID) | ORAL | Status: DC

## 2015-04-02 NOTE — Progress Notes (Signed)
Mailed to pt, copy of his lab results.

## 2015-04-08 ENCOUNTER — Telehealth: Payer: Self-pay | Admitting: *Deleted

## 2015-04-08 NOTE — Telephone Encounter (Signed)
Form,[employer] received,completed by Jeralene Huffarolyn and Casandra faxed 04-08-15.

## 2015-09-16 ENCOUNTER — Emergency Department (INDEPENDENT_AMBULATORY_CARE_PROVIDER_SITE_OTHER): Payer: BLUE CROSS/BLUE SHIELD

## 2015-09-16 ENCOUNTER — Encounter (HOSPITAL_COMMUNITY): Payer: Self-pay | Admitting: *Deleted

## 2015-09-16 ENCOUNTER — Emergency Department (INDEPENDENT_AMBULATORY_CARE_PROVIDER_SITE_OTHER)
Admission: EM | Admit: 2015-09-16 | Discharge: 2015-09-16 | Disposition: A | Discharge status: Home / Self Care | Payer: BLUE CROSS/BLUE SHIELD | Source: Home / Self Care | Attending: Family Medicine | Admitting: Family Medicine

## 2015-09-16 DIAGNOSIS — S39012A Strain of muscle, fascia and tendon of lower back, initial encounter: Secondary | ICD-10-CM

## 2015-09-16 MED ORDER — IBUPROFEN 800 MG PO TABS: 800.0000 mg | ORAL_TABLET | Freq: Three times a day (TID) | ORAL | Status: DC

## 2015-09-16 MED ORDER — CYCLOBENZAPRINE HCL 5 MG PO TABS: 5.0000 mg | ORAL_TABLET | Freq: Three times a day (TID) | ORAL | Status: DC | PRN

## 2015-09-16 NOTE — ED Provider Notes (Signed)
CSN: [id number]     Arrival date & time 09/16/15  1303 History   First MD Initiated Contact with Patient 09/16/15 1328     No chief complaint on file.  (Consider location/radiation/quality/duration/timing/severity/associated sxs/prior Treatment) Patient is a 39 y.o. male presenting with back pain. The history is provided by the patient. The history is limited by a language barrier. Language interpreter used: sing lang interp present.  Back Pain Location:  Lumbar spine Quality:  Cramping Radiates to:  Does not radiate Pain severity:  Mild Onset quality:  Sudden Duration:  1 week Progression:  Unchanged Chronicity:  New Context: lifting heavy objects   Relieved by:  None tried Worsened by:  Nothing tried Ineffective treatments:  None tried Associated symptoms: no abdominal pain, no chest pain, no leg pain, no numbness, no perianal numbness, no tingling and no weakness   Risk factors: lack of exercise     Past Medical History  Diagnosis Date  . Seizures (HCC)   . 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 Cerebral palsy (HCC)   . Deaf    History reviewed. No pertinent past surgical history. Family History  Problem Relation Age of Onset  . Heart attack Father   . COPD Father    Social History  Substance Use Topics  . Smoking status: Never Smoker   . Smokeless tobacco: Never Used  . Alcohol Use: No    Review of Systems  Cardiovascular: Negative for chest pain.  Gastrointestinal: Negative.  Negative for abdominal pain.  Genitourinary: Negative.   Musculoskeletal: Positive for back pain. Negative for joint swelling and gait problem.  Skin: Negative.   Neurological: Negative for tingling, weakness and numbness.  All other systems reviewed and are negative.   Allergies  Review of patient's allergies indicates no known allergies.  Home Medications   Prior to Admission medications   Medication Sig Start Date End Date Taking? Authorizing Provider  carbamazepine (EPITOL) 200 MG  tablet Take 1 tablet (200 mg total) by mouth 3 (three) times daily. 04/02/15   Nilda Riggs, NP  cyclobenzaprine (FLEXERIL) 5 MG tablet Take 1 tablet (5 mg total) by mouth 3 (three) times daily as needed for muscle spasms. 09/16/15   Linna Hoff, MD  ibuprofen (ADVIL,MOTRIN) 800 MG tablet Take 1 tablet (800 mg total) by mouth 3 (three) times daily. 09/16/15   Linna Hoff, MD   Meds Ordered and Administered this Visit  Medications - No data to display  BP 119/80 mmHg  Pulse 69  Temp(Src) 98.1 F (36.7 C) (Oral)  Resp 16  SpO2 98% No data found.   Physical Exam  Constitutional: He is oriented to person, place, and time. He appears well-developed and well-nourished.  Abdominal: Soft. Bowel sounds are normal.  Musculoskeletal: He exhibits tenderness.       Lumbar back: He exhibits tenderness, pain and spasm. He exhibits normal range of motion, no bony tenderness, no swelling, no deformity and normal pulse.       Back:  Neurological: He is alert and oriented to person, place, and time.  Skin: Skin is warm and dry.  Nursing note and vitals reviewed.   ED Course  Procedures (including critical care time)  Labs Review Labs Reviewed - No data to display  Imaging Review Dg Lumbar Spine Complete  09/16/2015   CLINICAL DATA:  Lifting injury 1 week ago, persistent low back and right lower extremity discomfort  EXAM: LUMBAR SPINE - COMPLETE 4+ VIEW  COMPARISON:  Abdominal pelvic  CT scan of March 04, 2015 and lumbar spine series of May 20, 2005  FINDINGS: The lumbar vertebral bodies are preserved in height. There is mild disc space narrowing at L4-5 unchanged since March 2016 and slightly more prominent than in June of 2006. There is no spondylolisthesis. There is facet joint hypertrophy at L4-5 and at L5-S1. There is no definite pars defect. The pedicles and transverse processes are intact. The observed portions of the sacrum are normal.  IMPRESSION: There is mild disc space  narrowing at L4-5. The disc space heights elsewhere are preserved. There is no acute bony abnormality. There is mild facet joint hypertrophy at L4-5 and L5-S1.   Electronically Signed   By: David  [place] M.D.   On: 09/16/2015 14:30     Visual Acuity Review  Right Eye Distance:   Left Eye Distance:   Bilateral Distance:    Right Eye Near:   Left Eye Near:    Bilateral Near:         MDM   1. Strain of lumbar paraspinal muscle, initial encounter       Linna Hoff, MD 09/16/15 2107

## 2015-09-17 ENCOUNTER — Telehealth: Payer: Self-pay | Admitting: *Deleted
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
## 2015-09-17 NOTE — Telephone Encounter (Signed)
Form,[employer]V Parking Placard sent to [place] and Eber Jones 09/17/15.

## 2015-09-17 NOTE — Telephone Encounter (Signed)
OK there should be a form to fill out

## 2015-09-17 NOTE — Telephone Encounter (Signed)
Completed and signed.  Given to Stanton Kidney in MR.

## 2015-09-25 ENCOUNTER — Telehealth: Payer: Self-pay | Admitting: *Deleted

## 2015-09-25 NOTE — Telephone Encounter (Signed)
Form,[employer] Parking Placard received, completed by Fernande Brasarolyn and Sandy at front desk for patient 09/17/15. Patient signed release 09/25/15.

## 2015-10-21 ENCOUNTER — Other Ambulatory Visit: Payer: Self-pay | Admitting: Sports Medicine

## 2015-10-21 DIAGNOSIS — M545 Low back pain: Secondary | ICD-10-CM

## 2015-10-23 ENCOUNTER — Ambulatory Visit
Admission: RE | Admit: 2015-10-23 | Discharge: 2015-10-23 | Disposition: A | Discharge status: Home / Self Care | Payer: BLUE CROSS/BLUE SHIELD | Source: Ambulatory Visit | Attending: Sports Medicine | Admitting: Sports Medicine

## 2015-10-23 DIAGNOSIS — M545 Low back pain: Secondary | ICD-10-CM

## 2016-03-31 ENCOUNTER — Telehealth: Payer: Self-pay | Admitting: *Deleted

## 2016-03-31 ENCOUNTER — Encounter: Payer: Self-pay | Admitting: Nurse Practitioner

## 2016-03-31 ENCOUNTER — Ambulatory Visit (INDEPENDENT_AMBULATORY_CARE_PROVIDER_SITE_OTHER): Payer: BLUE CROSS/BLUE SHIELD | Admitting: Nurse Practitioner

## 2016-03-31 VITALS — BP 126/81 | HR 73 | Ht 69.0 in | Wt 161.6 lb

## 2016-03-31 DIAGNOSIS — Z5181 Encounter for therapeutic drug level monitoring: Secondary | ICD-10-CM | POA: Diagnosis not present

## 2016-03-31 DIAGNOSIS — H9190 Unspecified hearing loss, unspecified ear: Secondary | ICD-10-CM | POA: Diagnosis not present

## 2016-03-31 DIAGNOSIS — G40209 Localization-related (focal) (partial) symptomatic epilepsy and epileptic syndromes with complex partial seizures, not intractable, without status epilepticus: Secondary | ICD-10-CM

## 2016-03-31 NOTE — Patient Instructions (Signed)
Will obtain labs today CBC, CMP along with carbamazepine level Continue carbamazepine at current dose will refill once labs are back Call for any seizure activity Follow-up yearly and when necessary

## 2016-03-31 NOTE — Telephone Encounter (Signed)
Called [facility] (sign language interpreter services). LVM for Maddie- coordinator of [facility] to call. No interpreter here yet for pt at 8:04am. ELK Spoke to pt in office. He states office does not open until 9am. 03/31/16.  Called again. Durwin GlazeKelly Owen- executive director. Tried calling on call physician- [facility] Theme park managerinterpreter coordinator. LVM again for her to call. 8:19am 03/31/16. Called on call again and LVM asking for interpreter to be here before 9am if possible. Interpreter not here yet. Gave pt name/DOB, phone number.   Katie, interpreter showed up at 823am. She states she was at the physical therapy office. Apologized. She had been there since 81745am.  Maddie with [facility] called and states Velora HecklerMark Lineburgh is on his way as interpreter for pt. He should be here about 10min. She apologized the other person did not show up. This was at 847am. Advised other interpreter ended up showing up. Let front desk check in know to let him know interpreter showed and we do not need him.

## 2016-03-31 NOTE — Progress Notes (Signed)
[facility]  PATIENT: Mitchell Jensen DOB: 14-Mar-1976   REASON FOR VISIT: Follow-up for epilepsy, patient is deaf, gait abnormality HISTORY FROM: Interpreter    HISTORY OF PRESENT ILLNESS:Mitchell Jensen, 40 year old returns for follow up for seizure disorder, he has been well-controlled on carbamazepine with last seizure occurring in 1997. 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 He is deaf and has an interpreter for signing purposes He denies headache, bowel or bladder incontinence, staring spells.He denies that he has any difficulty sleeping. He denies missing any doses of his carbamazepine. He does not have a primary care provider . He sustained a work injury to his back in October. He is getting some physical therapy however he may have surgery next month according to the patient He returns for reevaluation.  HISTORY: of complex partial seizure disorder. He has been followed by our practice since December of 1997. He has history of spells inconsistent staring that represents his complex partial seizure disorder. He is the product of a complicated second pregnancy and was born prematurely. 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 He is currently on carbamazepine 200 mg 3 times daily without side effects. He denies any feelings of being off balance or falls Date of last seizure 1997. No staring spells, confusion, sleep disturbances, lapses of time, headache and bowel and bladder incontinence.     REVIEW OF SYSTEMS: Full 14 system review of systems performed and notable only for those listed, all others are neg:  Constitutional: neg  Cardiovascular: neg Ear/Nose/Throat: neg   Skin: neg Eyes: neg Respiratory: neg Gastroitestinal: neg  Hematology/Lymphatic: neg  Endocrine: neg Musculoskeletal: Back pain  Allergy/Immunology: neg Neurological: neg Psychiatric: neg Sleep : neg   ALLERGIES: No Known Allergies  HOME MEDICATIONS: Outpatient Prescriptions Prior to Visit  Medication Sig Dispense Refill  . carbamazepine (EPITOL) 200 MG tablet Take 1 tablet (200 mg total) by mouth 3 (three) times daily. 90 tablet 11  . ibuprofen (ADVIL,MOTRIN) 800 MG tablet Take 1 tablet (800 mg total) by mouth 3 (three) times daily. 30 tablet 0  . cyclobenzaprine (FLEXERIL) 5 MG tablet Take 1 tablet (5 mg total) by mouth 3 (three) times daily as needed for muscle spasms. 30 tablet 0   No facility-administered medications prior to visit.    PAST MEDICAL HISTORY: Past Medical History  Diagnosis Date  . Seizures (HCC)   . 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 Cerebral palsy (HCC)   . Deaf     PAST SURGICAL HISTORY: History reviewed. No pertinent past surgical history.  FAMILY HISTORY: Family History  Problem Relation Age of Onset  . Heart attack Father   . COPD Father     SOCIAL HISTORY: Social History   Social History  . Marital Status: Single    Spouse Name: N/A  . Number of Children: 0  . 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 Smoking status: Never Smoker   . Smokeless tobacco: Never Used  . Alcohol Use: No  . Drug Use: No  . Sexual Activity: Not on file   Other Topics Concern  . 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   Patient lives at home alone.    Patient has a HS.    Patient is left handed.    Patient drinks caffeine every once in a while. And sometimes if he drinks caffeine while working.       PHYSICAL EXAM  Filed Vitals:   03/31/16 0830  BP: 126/81  Pulse: 73  Height: 5\' 9"   (1.753 m)  Weight: 161 lb 9.6 oz (73.301 kg)   Body mass index is 23.85 kg/(m^2). Generalized: Well developed, in no acute distress well groomed,  Head: normocephalic and atraumatic,. Oropharynx benign  Neck: Supple, no carotid bruits  Cardiac: Regular rate rhythm, no murmur  Neurological examination   Mentation: Alert. Follows all commands given by an interpreter that signs, patient is deaf.   Cranial nerve II-XII: Pupils were equal round reactive to light extraocular movements were full, visual field were full on confrontational test. Facial sensation and strength were normal. Patient is DEAF. Uvula tongue midline. head turning and shoulder shrug were normal and symmetric.Tongue protrusion into cheek strength was normal. Motor: normal bulk and tone, full strength in the BUE, BLE, mild decreased right grip strength.4/5 RLE  Sensory: normal and symmetric to light touch, in the upper and lower extremities Coordination: finger-nose-finger, heel-to-shin bilaterally, no dysmetria Reflexes: Increased on the right as compared to the left plantar responses were flexor bilaterally. Gait and Station: In wheelchair not ambulated  DIAGNOSTIC DATA (LABS, IMAGING, TESTING) - I reviewed patient records, labs, notes, testing and imaging myself where available.  Lab Results  Component Value Date   WBC 4.9 04/01/2015   HGB 11.1* 03/06/2015   HCT 39.0 04/01/2015   MCV 87 04/01/2015   PLT 200 04/01/2015      Component Value Date/Time   NA 144 04/01/2015 1015   NA 142 03/06/2015 0441   K 3.9 04/01/2015 1015   CL 104 04/01/2015 1015   CO2 27 04/01/2015 1015   GLUCOSE 57* 04/01/2015 1015   GLUCOSE 95 03/06/2015 0441   BUN 13 04/01/2015 1015   BUN 5* 03/06/2015 0441   CREATININE 1.06 04/01/2015 1015   CALCIUM 9.3 04/01/2015 1015   PROT 7.0 04/01/2015 1015   PROT 5.6* 03/06/2015 0441   ALBUMIN 4.3 04/01/2015 1015   ALBUMIN 3.1* 03/06/2015 0441   AST 19 04/01/2015 1015   ALT 20  04/01/2015 1015   ALKPHOS 43 04/01/2015 1015   BILITOT 0.4 04/01/2015 1015   BILITOT 0.6 03/06/2015 0441   GFRNONAA 90 04/01/2015 1015   GFRAA 104 04/01/2015 1015    ASSESSMENT AND PLAN 40 y.o. 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 here to follow-up.Last seizure event occurred in 1997. The patient is a current patient of Dr.Willis who is out of the office today . This note is sent to the work in doctor.     Will obtain labs today CBC, CMP along with carbamazepine level Continue carbamazepine at current dose will refill once labs are back Call for any seizure activity Follow-up yearly and when necessary  Nilda RiggsNancy Carolyn Azaiah Mello, [facility] CenterGNP, [facility], APRN  [facility] [address], [address] [place], [place] [address] [phone]

## 2016-04-01 ENCOUNTER — Encounter: Payer: Self-pay | Admitting: Nurse Practitioner

## 2016-04-01 ENCOUNTER — Other Ambulatory Visit: Payer: Self-pay | Admitting: Nurse Practitioner

## 2016-04-01 LAB — CBC WITH DIFFERENTIAL/PLATELET
BASOS ABS: 0 10*3/uL (ref 0.0–0.2)
Basos: 1 %
EOS (ABSOLUTE): 0.1 10*3/uL (ref 0.0–0.4)
Eos: 2 %
HEMOGLOBIN: 13.8 g/dL (ref 12.6–17.7)
Hematocrit: 43.3 % (ref 37.5–51.0)
Immature Grans (Abs): 0 10*3/uL (ref 0.0–0.1)
Immature Granulocytes: 0 %
LYMPHS ABS: 4 10*3/uL — AB (ref 0.7–3.1)
Lymphs: 60 %
MCH: 28.7 pg (ref 26.6–33.0)
MCHC: 31.9 g/dL (ref 31.5–35.7)
MCV: 90 fL (ref 79–97)
Monocytes Absolute: 0.4 10*3/uL (ref 0.1–0.9)
Monocytes: 6 %
NEUTROS ABS: 2.1 10*3/uL (ref 1.4–7.0)
Neutrophils: 31 %
PLATELETS: 196 10*3/uL (ref 150–379)
RBC: 4.81 x10E6/uL (ref 4.14–5.80)
RDW: 13.1 % (ref 12.3–15.4)
WBC: 6.6 10*3/uL (ref 3.4–10.8)

## 2016-04-01 LAB — COMPREHENSIVE METABOLIC PANEL
ALBUMIN: 4.3 g/dL (ref 3.5–5.5)
ALK PHOS: 41 IU/L (ref 39–117)
ALT: 29 IU/L (ref 0–44)
AST: 29 IU/L (ref 0–40)
Albumin/Globulin Ratio: 1.7 (ref 1.2–2.2)
BILIRUBIN TOTAL: 0.5 mg/dL (ref 0.0–1.2)
BUN / CREAT RATIO: 16 (ref 9–20)
BUN: 19 mg/dL (ref 6–20)
CHLORIDE: 104 mmol/L (ref 96–106)
CO2: 24 mmol/L (ref 18–29)
Calcium: 9.1 mg/dL (ref 8.7–10.2)
Creatinine, Ser: 1.19 mg/dL (ref 0.76–1.27)
GFR calc Af Amer: 90 mL/min/{1.73_m2} (ref >59)
GFR calc non Af Amer: 78 mL/min/{1.73_m2} (ref >59)
GLOBULIN, TOTAL: 2.6 g/dL (ref 1.5–4.5)
Glucose: 112 mg/dL — ABNORMAL HIGH (ref 65–99)
POTASSIUM: 4.4 mmol/L (ref 3.5–5.2)
SODIUM: 145 mmol/L — AB (ref 134–144)
Total Protein: 6.9 g/dL (ref 6.0–8.5)

## 2016-04-01 LAB — CARBAMAZEPINE LEVEL, TOTAL: CARBAMAZEPINE LVL: 6.2 ug/mL (ref 4.0–12.0)

## 2016-04-01 IMAGING — MR MR LUMBAR SPINE W/O CM
4 of 6 series · 24 of 48 positions shown · non-contrast
Comparison: Lumbar spine plain films 09/16/2015. CT abdomen and
pelvis 03/04/2015.

CLINICAL DATA: RIGHT-sided low back pain since Monday September, 2015.
Lifting injury.

EXAM:
MRI LUMBAR SPINE WITHOUT CONTRAST
TECHNIQUE: Multiplanar, multisequence MR imaging of the lumbar spine was
performed. No intravenous contrast was administered.

[Series 6: T2 · sagittal · 4.0mm · 0.73mm/px · 6 of 17 slices shown (1 of 3)]
[im 1/17]
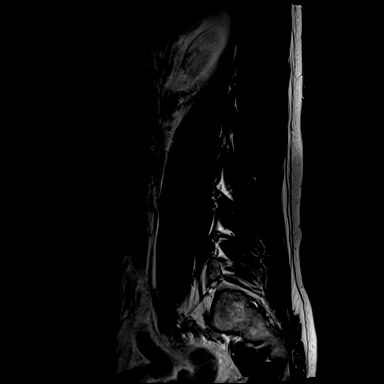
[im 4/17]
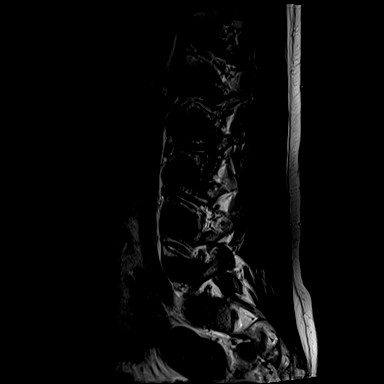
[im 7/17]
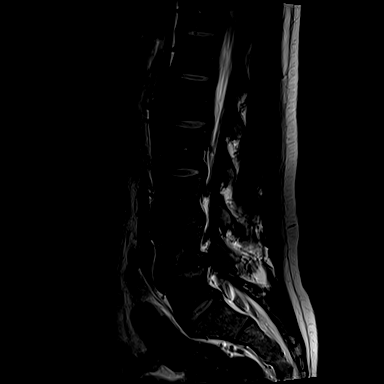
[im 10/17]
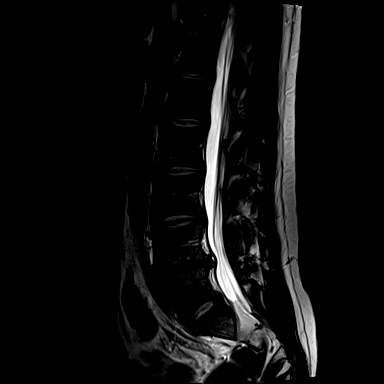
[im 13/17]
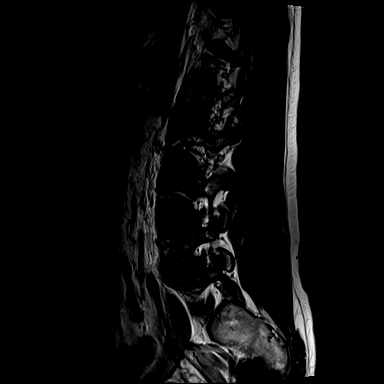
[im 17/17]
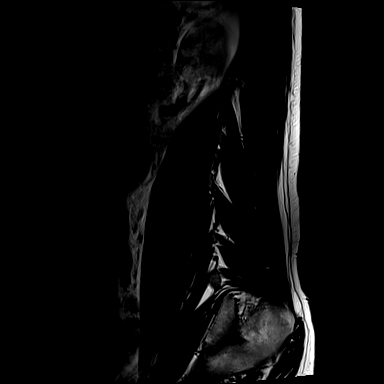

[Series 8: T1 · sagittal · 4.0mm · 0.88mm/px · 4 of 17 slices shown]
[im 1/17]
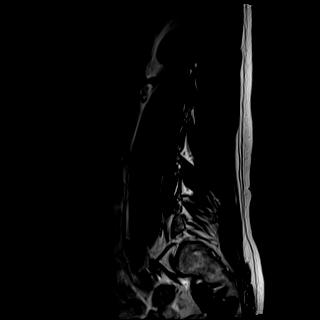
[im 3/17]
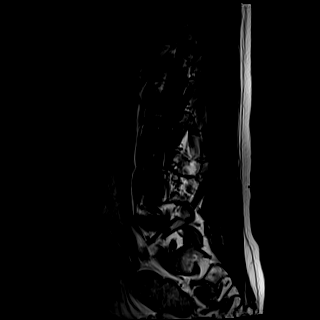
[im 9/17]
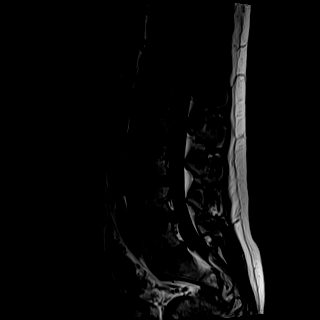
[im 14/17]
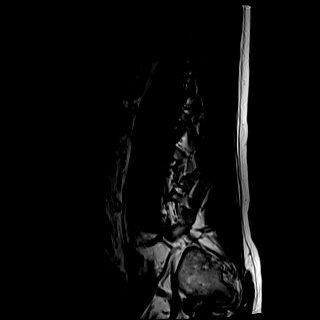

[Series 11: T2 · axial · 4.0mm · 0.56mm/px · z∈[-31,+43]mm · 6 of 15 slices shown (2 of 3)]
[im 1/15]
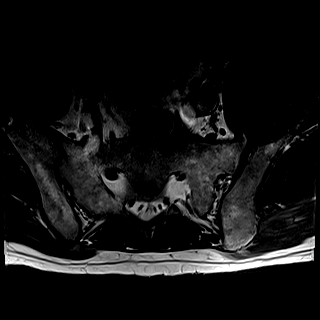
[im 3/15]
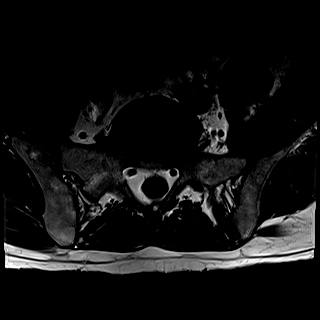
[im 6/15]
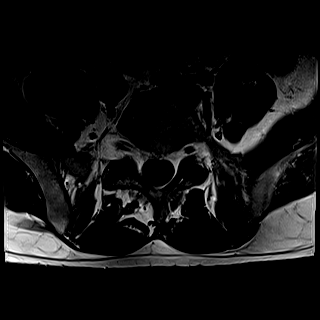
[im 9/15]
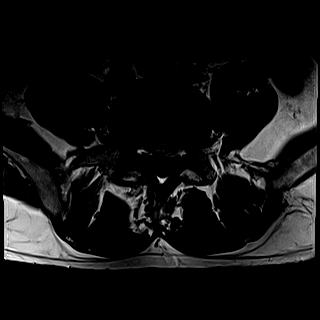
[im 12/15]
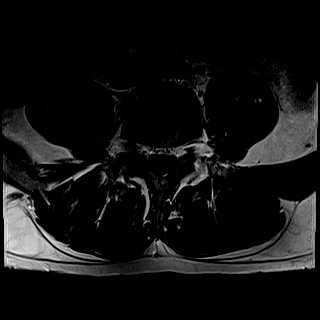
[im 15/15]
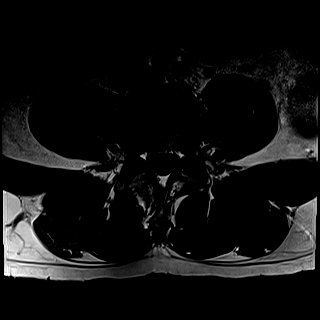

[Series 16: T2 · axial · 4.0mm · 0.28mm/px · z∈[-31,+145]mm · 8 of 36 slices shown (3 of 3)]
[im 1/36]
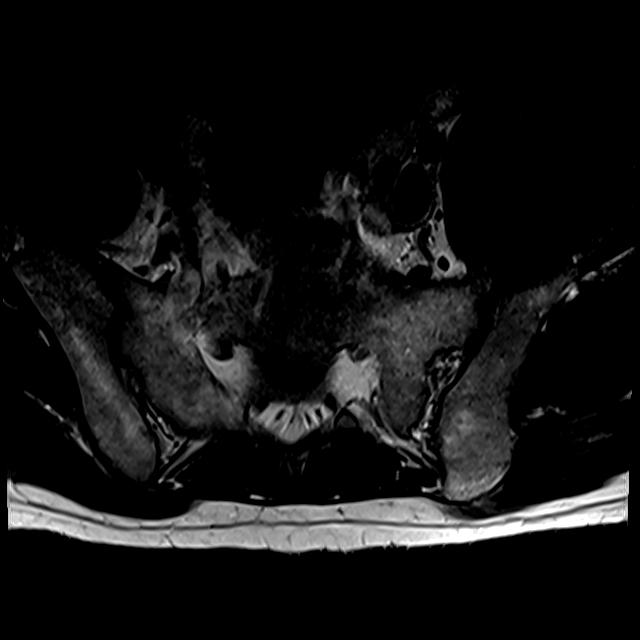
[im 6/36]
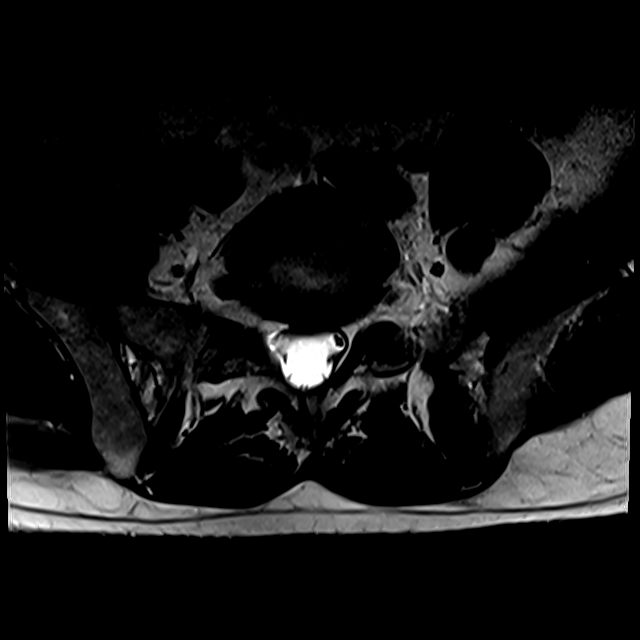
[im 11/36]
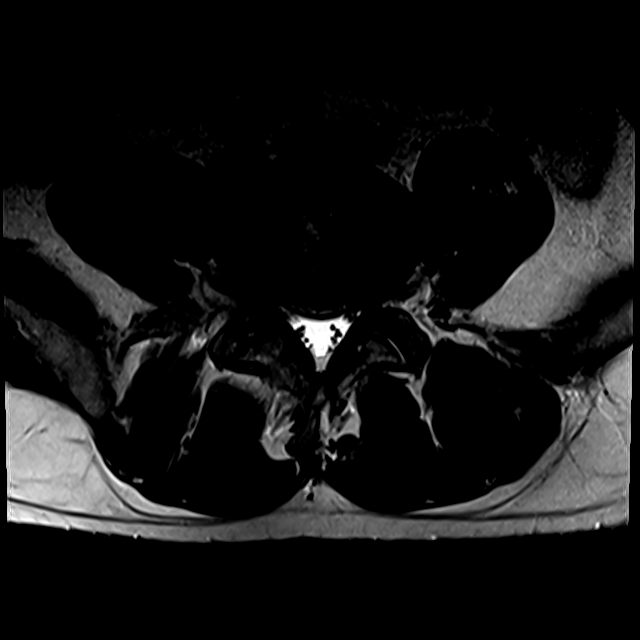
[im 17/36]
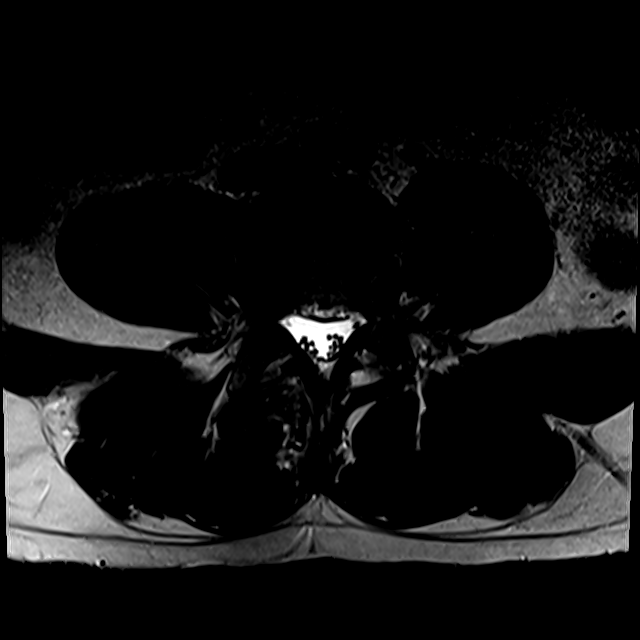
[im 19/36]
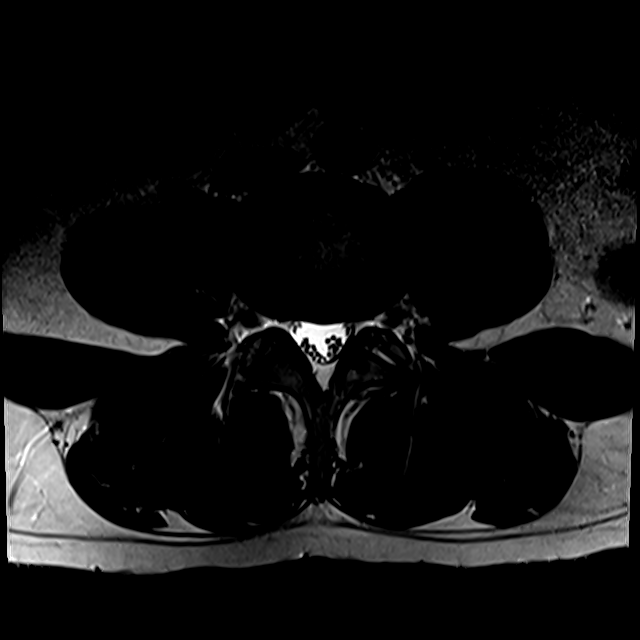
[im 25/36]
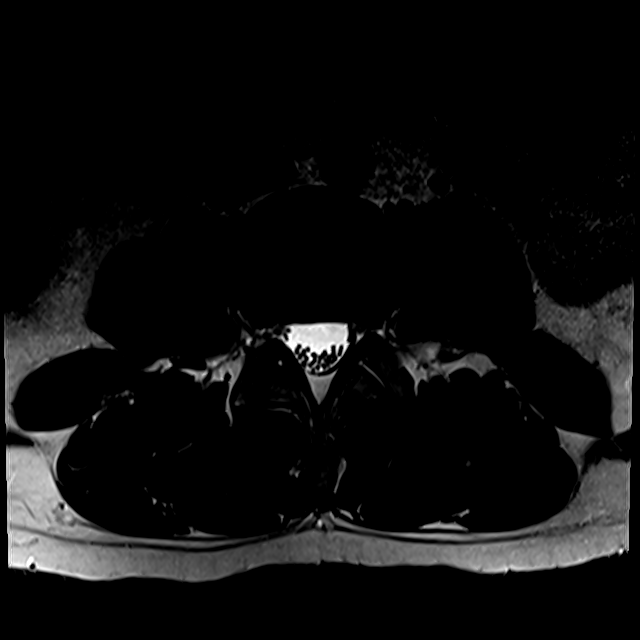
[im 30/36]
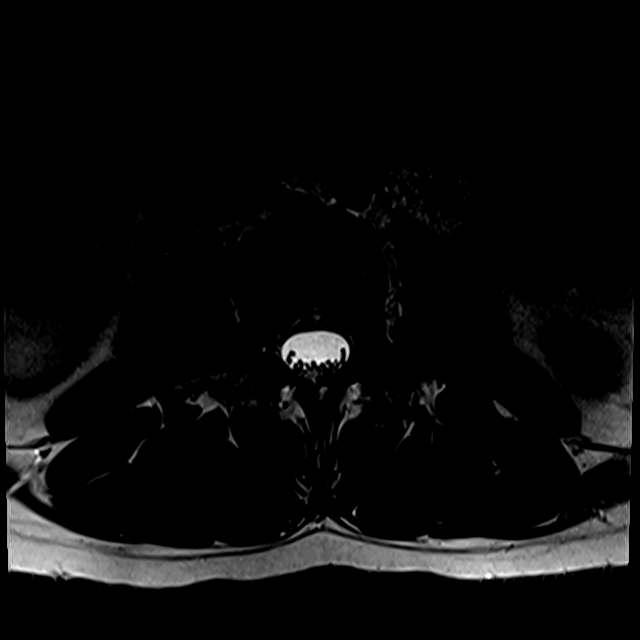
[im 36/36]
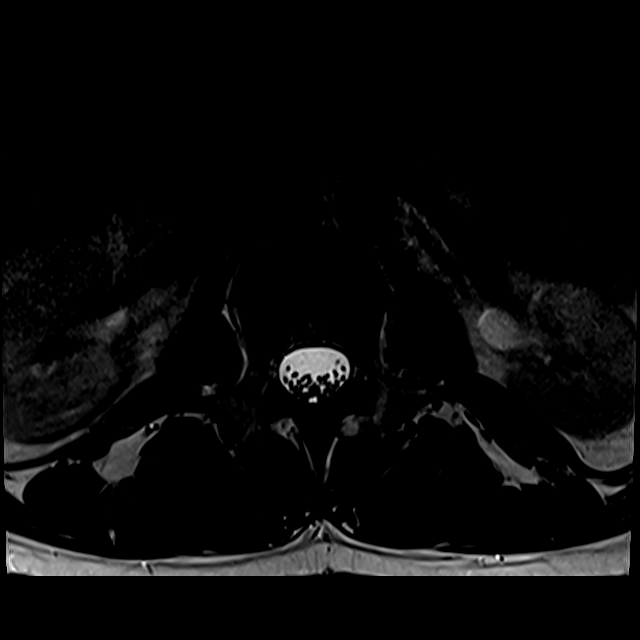

[24 of 48 positions shown; findings below may reference images not displayed]

FINDINGS: Segmentation: Normal.

Alignment:  Normal.

Vertebrae: No worrisome osseous lesion.Marked endplate reactive
changes accompanying Schmorl's node at L4-5, T1 hypointense, T2 and
STIR hyperintense suggesting acuity.

Conus medullaris: Normal in size, signal, and location.

Paraspinal tissues: No evidence for hydronephrosis or paravertebral
mass.

Disc levels:

L1-L2:  Normal.

L2-L3:  Normal.

L3-L4:  Normal.

L4-L5: Chronic disc space narrowing related in part to Schmorl's
node. Superimposed large disc extrusion, probable free fragment
standing into the spinal canal posteriorly. Mild to moderate
stenosis. RIGHT greater than LEFT subarticular zone narrowing
compressing the L5 nerve roots. Asymmetric RIGHT-sided foraminal
narrowing also likely to affect the RIGHT L4 nerve root.

L5-S1:  Mild bulge.  No impingement.

Compared to the prior CT, similar appearance is noted. Plain films
demonstrate disc space narrowing with normal alignment.
IMPRESSION: Chronic degenerative disc disease at L4-5, with disc space
narrowing, Schmorl's node with accompanying acute endplate edema,
and central and rightward disc extrusion. RIGHT greater than LEFT L4
and L5 nerve root impingement are observed along with mild to
moderate central canal stenosis.

## 2016-04-01 MED ORDER — CARBAMAZEPINE 200 MG PO TABS: 200.0000 mg | ORAL_TABLET | Freq: Three times a day (TID) | ORAL | Status: DC

## 2016-04-15 ENCOUNTER — Telehealth: Payer: Self-pay | Admitting: Nurse Practitioner

## 2016-04-15 NOTE — Telephone Encounter (Signed)
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 Workman's comp claim . I gave fax # to our office

## 2016-04-24 NOTE — Telephone Encounter (Signed)
Need a fax number and phone #  for the company.

## 2016-05-21 ENCOUNTER — Telehealth: Payer: Self-pay | Admitting: *Deleted

## 2016-05-21 NOTE — Telephone Encounter (Signed)
[facility] records fax[facility], [facility] [id number] S.

## 2016-06-01 ENCOUNTER — Telehealth: Payer: Self-pay | Admitting: *Deleted

## 2016-06-01 NOTE — Telephone Encounter (Signed)
Pt records faxed to Claim Management on 06/01/2016.

## 2016-09-23 HISTORY — PX: BACK SURGERY: SHX140

## 2017-03-03 ENCOUNTER — Encounter: Payer: Self-pay | Admitting: Medical

## 2017-03-03 ENCOUNTER — Ambulatory Visit (INDEPENDENT_AMBULATORY_CARE_PROVIDER_SITE_OTHER): Payer: BLUE CROSS/BLUE SHIELD | Admitting: Medical

## 2017-03-03 VITALS — BP 142/98 | HR 67 | Ht 69.0 in | Wt 184.0 lb

## 2017-03-03 DIAGNOSIS — Z23 Encounter for immunization: Secondary | ICD-10-CM | POA: Diagnosis not present

## 2017-03-03 DIAGNOSIS — R030 Elevated blood-pressure reading, without diagnosis of hypertension: Secondary | ICD-10-CM | POA: Diagnosis not present

## 2017-03-03 DIAGNOSIS — Z7184 Encounter for health counseling related to travel: Secondary | ICD-10-CM

## 2017-03-03 DIAGNOSIS — H9190 Unspecified hearing loss, unspecified ear: Secondary | ICD-10-CM | POA: Diagnosis not present

## 2017-03-03 DIAGNOSIS — Z7189 Other specified counseling: Secondary | ICD-10-CM | POA: Insufficient documentation

## 2017-03-03 DIAGNOSIS — Z7185 Encounter for immunization safety counseling: Secondary | ICD-10-CM | POA: Insufficient documentation

## 2017-03-03 MED ORDER — CIPROFLOXACIN HCL 500 MG PO TABS: 500.0000 mg | ORAL_TABLET | Freq: Two times a day (BID) | ORAL | 0 refills | Status: DC

## 2017-03-03 NOTE — Patient Instructions (Addendum)
Per the CDC travel info for [place], I recommend the following vaccines: Typhoid Hepatitis A Tdap And updating routine vaccines - in your case I recommend Mumps Measles and Rubella booster, Chicken Pox booster since we don't know your immune status  Please call insurance to see what his coverage is for screening labs for immunity such as MMR and varicella titer?  What is his insurance coverage for just doing the vaccine boosters, giving MMR booster vaccine and Varicella chicken pox vaccine since we don't know his immune status.   We updated the first hepatitis A vaccine today.  He is due back for 2nd hepatitis A vaccine in 6 months We updated his tetanus diptheria and pertussis vaccine today.  This is good for 10 years I recommend [facility] for the Typhoid vaccine as we don't do this here.  I sent an antibiotic to your pharmacy called Cipro in the event you get travelers diarrhea.  (symptoms would be loose water stools.  However if bloody stool, fever, uncontrollable nausea and vomiting, then get checked out at the health facility there on your trip.  Have fun on your trip

## 2017-03-03 NOTE — Progress Notes (Signed)
Subjective: Chief Complaint  Patient presents with  . New Patient (Initial Visit)    pt is going out of  wants to know if he has all vaccine    Here as a new patient today.   Uses sign language interpreter.    From [place] originally.  Hasn't really had a PCP.  Does see neurology regularly.  Has hx/o seizures.  Sees Dr. Evlyn Courier at [facility] for seizures.  Last seizure 1997  He is traveling to [place] on vacation. Will be enjoying time, touring, cruise with girlfriend.   This will be his first time out of the country and first time on a cruise.   Here for travel and vaccine info.  Not planning to visit farms, touch or be exposure to wild animals or bodily fluids.  Plans to mainly be in tourist areas and on the ship.    Has been in usual state of health without c/o.  Past Medical History:  Diagnosis Date  . 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 Cerebral palsy ([place])   . Deaf   . Seizures ([place])    Current Outpatient Prescriptions on File Prior to Visit  Medication Sig Dispense Refill  . carbamazepine (EPITOL) 200 MG tablet Take 1 tablet (200 mg total) by mouth 3 (three) times daily. 90 tablet 11   No current facility-administered medications on file prior to visit.    Review of Systems Constitutional: -fever, -chills, -sweats, -unexpected weight change,-fatigue ENT: -runny nose, -ear pain, -sore throat Cardiology:  -chest pain, -palpitations, -edema Respiratory: -cough, -shortness of breath, -wheezing Gastroenterology: -abdominal pain, -nausea, -vomiting, -diarrhea, -constipation Hematology: -bleeding or bruising problems Musculoskeletal: -arthralgias, -myalgias, -joint swelling, -back pain Ophthalmology: -vision changes Urology: -dysuria, -difficulty urinating, -hematuria, -urinary frequency, -urgency Neurology: -headache, -weakness, -tingling, -numbness     Objective: BP (!) 142/98   Pulse 67   Ht '5\' 9"'  (1.753 m)   Wt 184 lb (83.5 kg)   SpO2 97%   BMI 27.17  kg/m   General appearance: alert, no distress, WD/WN,  HEENT: normocephalic, sclerae anicteric, TMs pearly, nares patent, no discharge or erythema, pharynx normal Oral cavity: MMM, no lesions Neck: supple, no lymphadenopathy, no thyromegaly, no masses Heart: RRR, normal S1, S2, no murmurs Lungs: CTA bilaterally, no wheezes, rhonchi, or rales Abdomen: +bs, soft, non tender, non distended, no masses, no hepatomegaly, no splenomegaly Pulses: 2+ symmetric, upper and lower extremities, normal cap refill   Assessment: Encounter Diagnoses  Name Primary?  . Elevated blood-pressure reading without diagnosis of hypertension Yes  . Travel advice encounter   . Need for hepatitis A vaccination   . Need for diphtheria-tetanus-pertussis (Tdap) vaccine   . Vaccine counseling   . Deafness, unspecified laterality     Plan: Elevated BP - f/u for physical soon, c/t to monitor  Discussed his upcoming vacation travel to [place].  reviewed CDC recommendations, reviewed the [facility]  He did not actually have vaccines done there.  Counseled on the Hepatitis A virus vaccine.  Vaccine information sheet given.  Hepatitis A vaccine given after consent obtained.  Patient was advised to return in 6 months for Hep A #2.  Counseled on the Tdap (tetanus, diptheria, and acellular pertussis) vaccine.  Vaccine information sheet given. Tdap vaccine given after consent obtained.  Patient Instructions  Per the CDC travel info for [place], I recommend the following vaccines: Typhoid Hepatitis A Tdap And updating routine vaccines - in your case  I recommend Mumps Measles and Rubella booster, Chicken Pox booster since we don't know your immune status  Please call insurance to see what his coverage is for screening labs for immunity such as MMR and varicella titer?  What is his insurance coverage for just doing the vaccine boosters, giving MMR booster vaccine and Varicella  chicken pox vaccine since we don't know his immune status.   We updated the first hepatitis A vaccine today.  He is due back for 2nd hepatitis A vaccine in 6 months We updated his tetanus diptheria and pertussis vaccine today.  This is good for 10 years I recommend he go back to [facility] or [facility] dept for the Typhoid vaccine as we don't do this here.  I sent an antibiotic to your pharmacy called Cipro in the event you get travelers diarrhea.  (symptoms would be loose water stools.  However if bloody stool, fever, uncontrollable nausea and vomiting, then get checked out at the health facility there on your trip.  Have fun on your trip

## 2017-03-31 ENCOUNTER — Encounter: Payer: Self-pay | Admitting: Nurse Practitioner

## 2017-03-31 ENCOUNTER — Ambulatory Visit (INDEPENDENT_AMBULATORY_CARE_PROVIDER_SITE_OTHER): Payer: BLUE CROSS/BLUE SHIELD | Admitting: Nurse Practitioner

## 2017-03-31 VITALS — BP 133/78 | HR 70 | Wt 176.0 lb

## 2017-03-31 DIAGNOSIS — H9190 Unspecified hearing loss, unspecified ear: Secondary | ICD-10-CM | POA: Diagnosis not present

## 2017-03-31 DIAGNOSIS — G40209 Localization-related (focal) (partial) symptomatic epilepsy and epileptic syndromes with complex partial seizures, not intractable, without status epilepticus: Secondary | ICD-10-CM

## 2017-03-31 DIAGNOSIS — Z5181 Encounter for therapeutic drug level monitoring: Secondary | ICD-10-CM | POA: Diagnosis not present

## 2017-03-31 MED ORDER — CARBAMAZEPINE 200 MG PO TABS: 200.0000 mg | ORAL_TABLET | Freq: Three times a day (TID) | ORAL | 11 refills | Status: DC

## 2017-03-31 NOTE — Patient Instructions (Signed)
Will obtain labs in 1 month.  CBC, CMP along with carbamazepine level Continue carbamazepine at current dose will refill  Call for any seizure activity Follow-up yearly and when necessary

## 2017-03-31 NOTE — Progress Notes (Signed)
I have read the note, and I agree with the clinical assessment and plan.  WILLIS,CHARLES KEITH   

## 2017-03-31 NOTE — Progress Notes (Signed)
[facility]  PATIENT: Mitchell Jensen DOB: [birth year]   REASON FOR VISIT: Follow-up for epilepsy, patient is deaf, gait abnormality HISTORY FROM: Interpreter    HISTORY OF PRESENT ILLNESS:Mitchell Jensen, 41 year old returns for follow up for seizure disorder, he has been well-controlled on carbamazepine with last seizure occurring in 1997. He has been off of his medication for over a month due to lack of insurance and the medication cost would be $90. He denies any seizure events during this time.  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 He is deaf and has an interpreter for signing purposes He denies headache, bowel or bladder incontinence, staring spells.He denies that he has any difficulty sleeping. He had back surgery 09/23/2016 by Dr. Yetta Barre. He had sustained a work injury. He does not have a primary care provider . He returns for reevaluation.  HISTORY: of complex partial seizure disorder. He has been followed by our practice since December of 1997. He has history of spells inconsistent staring that represents his complex partial seizure disorder. He is the product of a complicated second pregnancy and was born prematurely. 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 He is currently on carbamazepine 200 mg 3 times daily without side effects. He denies any feelings of being off balance or falls Date of last seizure 1997. No staring spells, confusion, sleep disturbances, lapses of time, headache and bowel and bladder incontinence.     REVIEW OF SYSTEMS: Full 14 system review of systems performed and notable only for those listed, all others are neg:  Constitutional: neg    Cardiovascular: neg Ear/Nose/Throat: neg  Skin: neg Eyes: neg Respiratory: neg Gastroitestinal: neg  Hematology/Lymphatic: neg  Endocrine: neg Musculoskeletal: Back surgery in October  Allergy/Immunology: neg Neurological: History of seizure disorder Psychiatric: neg Sleep : neg   ALLERGIES: No Known Allergies  HOME MEDICATIONS: Outpatient Medications Prior to Visit  Medication Sig Dispense Refill  . carbamazepine (EPITOL) 200 MG tablet Take 1 tablet (200 mg total) by mouth 3 (three) times daily. (Patient not taking: Reported on 03/31/2017) 90 tablet 11  . ciprofloxacin (CIPRO) 500 MG tablet Take 1 tablet (500 mg total) by mouth 2 (two) times daily. 6 tablet 0   No facility-administered medications prior to visit.     PAST MEDICAL HISTORY: Past Medical History:  Diagnosis Date  . 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 Cerebral palsy (HCC)   . Deaf   . Seizures (HCC)     PAST SURGICAL HISTORY: Past Surgical History:  Procedure Laterality Date  . BACK SURGERY  09/23/2016   Dr Yetta Barre, lumbar -"removed damaged area"    FAMILY HISTORY: Family History  Problem Relation Age of Onset  . Heart attack Father   . COPD Father     SOCIAL HISTORY: Social History   Social History  . Marital status: Single    Spouse name: N/A  . Number of children: 0  . 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 Smoking status: Never Smoker  . Smokeless tobacco: Never Used  . Alcohol use No  . Drug use: No  . Sexual activity: Not on  file   Other Topics Concern  . 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   Patient lives at home alone.    Patient has a HS.    Patient is left handed.    Patient drinks caffeine every once in a while. And sometimes if he drinks caffeine while working.       PHYSICAL EXAM  Vitals:   03/31/17 0812   BP: 133/78  Pulse: 70  Weight: 176 lb (79.8 kg)   Body mass index is 25.99 kg/m. Generalized: Well developed, in no acute distress well groomed,  Head: normocephalic and atraumatic,. Oropharynx benign  Neck: Supple, no carotid bruits  Neurological examination   Mentation: Alert. Follows all commands given by an interpreter that signs, patient is deaf.   Cranial nerve II-XII: Pupils were equal round reactive to light extraocular movements were full, visual field were full on confrontational test. Facial sensation and strength were normal. Patient is DEAF. Uvula tongue midline. head turning and shoulder shrug were normal and symmetric.Tongue protrusion into cheek strength was normal. Motor: normal bulk and tone, full strength in the BUE, BLE, mild decreased right grip strength.4/5 RLE  Sensory: normal and symmetric to light touch, in the upper and lower extremities Coordination: finger-nose-finger, heel-to-shin bilaterally, no dysmetria Reflexes: Increased on the right as compared to the left plantar responses were flexor bilaterally. Gait and Station: Ambulated a short distance in the hall. Hemiparetic gait on the right without an assistive device unsteady with tandem. DIAGNOSTIC DATA (LABS, IMAGING, TESTING) - I reviewed patient records, labs, notes, testing and imaging myself where available.  Lab Results  Component Value Date   WBC 6.6 03/31/2016   HGB 11.1 (L) 03/06/2015   HCT 43.3 03/31/2016   MCV 90 03/31/2016   PLT 196 03/31/2016      Component Value Date/Time   NA 145 (H) 03/31/2016 0908   K 4.4 03/31/2016 0908   CL 104 03/31/2016 0908   CO2 24 03/31/2016 0908   GLUCOSE 112 (H) 03/31/2016 0908   GLUCOSE 95 03/06/2015 0441   BUN 19 03/31/2016 0908   CREATININE 1.19 03/31/2016 0908   CALCIUM 9.1 03/31/2016 0908   PROT 6.9 03/31/2016 0908   ALBUMIN 4.3 03/31/2016 0908   AST 29 03/31/2016 0908   ALT 29 03/31/2016 0908   ALKPHOS 41 03/31/2016 0908   BILITOT  0.5 03/31/2016 0908   GFRNONAA 78 03/31/2016 0908   GFRAA 90 03/31/2016 0908    ASSESSMENT AND PLAN 41 y.o. 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 here to follow-up.Last seizure event occurred in 1997. He has been off of his medication for over a month due to lack of insurance. He has just resumed his insurance and needs refills     Will obtain labs in 1 month.  CBC, CMP to monitor adverse effects carbamazepine  carbamazepine level to monitor for therapeutic level toxicity Continue carbamazepine at current dose will refill  Call for any seizure activity Follow-up yearly and when necessary  Nilda Riggs, [facility], APRN  [facility] [address], [address] [place], [place] [address] [phone]

## 2018-01-11 ENCOUNTER — Institutional Professional Consult (permissible substitution): Payer: BLUE CROSS/BLUE SHIELD | Admitting: Medical

## 2018-03-08 ENCOUNTER — Telehealth: Payer: Self-pay | Admitting: Nurse Practitioner

## 2018-03-08 NOTE — Telephone Encounter (Signed)
Pt called requesting a note for work explaining he can not do any heavy lifting at work, this way his boss can work around and let pt do different jobs(pt states the more detailed the better). Pt would like to come pick this note up tomorrow if possible.

## 2018-03-08 NOTE — Telephone Encounter (Signed)
I called and LMVM for pt (thru TTY) to call me back about the letter he is requesting.  ( he has an appt 04-05-18 with CM/NP), since we have not seen him since 03/2017, will address at appt.  Move up appt? If can.

## 2018-03-09 NOTE — Telephone Encounter (Signed)
Moved up appt and will address the note at the appt.

## 2018-03-28 NOTE — Progress Notes (Signed)
[facility]  PATIENT: Mitchell Jensen DOB: 10-Jul-1976   REASON FOR VISIT: Follow-up for epilepsy, patient is deaf, gait abnormality HISTORY FROM: Interpreter    HISTORY OF PRESENT ILLNESS:Mitchell Jensen, 42 year old returns for follow up for seizure disorder, he has been well-controlled on carbamazepine with last seizure occurring in 1997. He has been off of his medication for a while he cannot give me the exact timeframe due to [facility] seizure [facility].  He denies any seizure events during this time.  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 He is deaf and has an interpreter for signing purposes He denies headache, bowel or bladder incontinence, staring spells.He denies that he has any difficulty sleeping. He had back surgery 09/23/2016 by Dr. Yetta BarreJones. He had sustained a work injury.  He is back to work full-time.  [name] his co-pay is $4 a month for his medication.  He was made aware that we will renew his medications and that if it is more expensive than $4 a month he needs to talk with the pharmacist while he is there.  He returns for reevaluation  HISTORY: of complex partial seizure disorder. He has been followed by our practice since December of 1997. He has history of spells inconsistent staring that represents his complex partial seizure disorder. He is the product of a complicated second pregnancy and was born prematurely. 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 He is currently on carbamazepine 200 mg 3 times daily without side effects. He denies any feelings of being off balance or falls Date of last seizure 1997. No staring spells,  confusion, sleep disturbances, lapses of time, headache and bowel and bladder incontinence.     REVIEW OF SYSTEMS: Full 14 system review of systems performed and notable only for those listed, all others are neg:  Constitutional: neg  Cardiovascular: neg Ear/Nose/Throat: neg  Skin: neg Eyes: neg Respiratory: neg Gastroitestinal: neg  Hematology/Lymphatic: neg  Endocrine: neg Musculoskeletal: Back surgery in October 2017  Allergy/Immunology: neg Neurological: History of seizure disorder Psychiatric: neg Sleep : neg   ALLERGIES: No Known Allergies  HOME MEDICATIONS: Outpatient Medications Prior to Visit  Medication Sig Dispense Refill  . carbamazepine (EPITOL) 200 MG tablet Take 1 tablet (200 mg total) by mouth 3 (three) times daily. 90 tablet 11   No facility-administered medications prior to visit.     PAST MEDICAL HISTORY: Past Medical History:  Diagnosis Date  . 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 Cerebral palsy (HCC)   . Deaf   . Seizures (HCC)     PAST SURGICAL HISTORY: Past Surgical History:  Procedure Laterality Date  . BACK SURGERY  09/23/2016   Dr Yetta BarreJones, lumbar -"removed damaged area"    FAMILY HISTORY: Family History  Problem Relation Age of Onset  . Heart attack Father   . COPD Father     SOCIAL HISTORY: Social History   Socioeconomic History  . Marital status: Single    Spouse name: Not on file  . Number of children: 0  . Years of education: HS  . Highest education level: Not on file  Occupational History  . Occupation: Holiday representativestocker    Employer:  [employer]  Social Needs  . Financial resource strain: Not on file  . Food insecurity:    Worry: Not on file    Inability: Not on file  . Transportation needs:    Medical: Not on file    Non-medical: Not on file  Tobacco Use  . Smoking status: Never Smoker  . Smokeless tobacco: Never Used  Substance and Sexual Activity  . Alcohol use: No  . Drug use: No  . Sexual activity: Not on file    Lifestyle  . Physical activity:    Days per week: Not on file    Minutes per session: Not on file  . Stress: Not on file  Relationships  . Social connections:    Talks on phone: Not on file    Gets together: Not on file    Attends religious service: Not on file    Active member of club or organization: Not on file    Attends meetings of clubs or organizations: Not on file    Relationship status: Not on file  . Intimate partner violence:    Fear of current or ex partner: Not on file    Emotionally abused: Not on file    Physically abused: Not on file    Forced sexual activity: Not on file  Other Topics Concern  . 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   Patient lives at home alone.    Patient has a HS.    Patient is left handed.    Patient drinks caffeine every once in a while. And sometimes if he drinks caffeine while working.       PHYSICAL EXAM  Vitals:   03/29/18 1043  BP: 136/83  Pulse: 70  Weight: 164 lb 12.8 oz (74.8 kg)  Height: 5\' 9"  (1.753 m)   Body mass index is 24.34 kg/m. Generalized: Well developed, in no acute distress well groomed,  Head: normocephalic and atraumatic,. Oropharynx benign  Neck: Supple, Neurological examination   Mentation: Alert. Follows all commands given by an interpreter that signs, patient is deaf.   Cranial nerve II-XII: Pupils were equal round reactive to light extraocular movements were full, visual field were full on confrontational test. Facial sensation and strength were normal. Patient is DEAF. Uvula tongue midline. head turning and shoulder shrug were normal and symmetric.Tongue protrusion into cheek strength was normal. Motor: normal bulk and tone, full strength in the BUE, BLE, mild decreased right grip strength.4/5 RLE  Sensory: normal and symmetric to light touch, in the upper and lower extremities Coordination:  finger-nose-finger, heel-to-shin bilaterally, no dysmetria Reflexes: Increased on the right as compared to the left plantar responses were flexor bilaterally. Gait and Station: Ambulated a short distance in the hall. Hemiparetic gait on the right without an assistive device unsteady with tandem. DIAGNOSTIC DATA (LABS, IMAGING, TESTING) - I reviewed patient records, labs, notes, testing and imaging myself where available.  Lab Results  Component Value Date   WBC 6.6 03/31/2016   HGB 13.8 03/31/2016   HCT 43.3 03/31/2016   MCV 90 03/31/2016   PLT 196 03/31/2016      Component Value Date/Time   NA 145 (H) 03/31/2016 0908   K 4.4 03/31/2016 0908   CL 104 03/31/2016 0908   CO2 24 03/31/2016 0908   GLUCOSE 112 (H) 03/31/2016 0908   GLUCOSE 95 03/06/2015 0441   BUN 19  03/31/2016 0908   CREATININE 1.19 03/31/2016 0908   CALCIUM 9.1 03/31/2016 0908   PROT 6.9 03/31/2016 0908   ALBUMIN 4.3 03/31/2016 0908   AST 29 03/31/2016 0908   ALT 29 03/31/2016 0908   ALKPHOS 41 03/31/2016 0908   BILITOT 0.5 03/31/2016 0908   GFRNONAA 78 03/31/2016 0908   GFRAA 90 03/31/2016 0908    ASSESSMENT AND PLAN 42 y.o. 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 here to follow-up.Last seizure event occurred in 1997. He has been off of his medication due to expense.  Pharmacy was called and his co-pay is $4 per month   CBC, CMP to monitor adverse effects carbamazepine in 1 month  carbamazepine level to monitor for therapeutic level toxicity in 1 month after being back on meds Continue carbamazepine (Epitol )at current dose will refill  Call for any seizure activity Follow-up yearly and when necessary  Nilda Riggs, [facility], [facility], APRN  [facility] [address] [place], [place] [address] [phone]

## 2018-03-29 ENCOUNTER — Ambulatory Visit (INDEPENDENT_AMBULATORY_CARE_PROVIDER_SITE_OTHER): Payer: BLUE CROSS/BLUE SHIELD | Admitting: Nurse Practitioner

## 2018-03-29 ENCOUNTER — Encounter: Payer: Self-pay | Admitting: Nurse Practitioner

## 2018-03-29 VITALS — BP 136/83 | HR 70 | Ht 69.0 in | Wt 164.8 lb

## 2018-03-29 DIAGNOSIS — Z7184 Encounter for health counseling related to travel: Secondary | ICD-10-CM | POA: Insufficient documentation

## 2018-03-29 DIAGNOSIS — Z5181 Encounter for therapeutic drug level monitoring: Secondary | ICD-10-CM | POA: Diagnosis not present

## 2018-03-29 DIAGNOSIS — G40209 Localization-related (focal) (partial) symptomatic epilepsy and epileptic syndromes with complex partial seizures, not intractable, without status epilepticus: Secondary | ICD-10-CM

## 2018-03-29 MED ORDER — CARBAMAZEPINE 200 MG PO TABS: 200.0000 mg | ORAL_TABLET | Freq: Three times a day (TID) | ORAL | 11 refills | Status: DC

## 2018-03-29 NOTE — Progress Notes (Signed)
I have read the note, and I agree with the clinical assessment and plan.  Charles K Willis   

## 2018-03-29 NOTE — Patient Instructions (Signed)
CBC, CMP to monitor adverse effects carbamazepine  carbamazepine level to monitor for therapeutic level toxicity Continue carbamazepine at current dose will refill  Call for any seizure activity Follow-up yearly and when necessary

## 2018-04-05 ENCOUNTER — Ambulatory Visit: Payer: BLUE CROSS/BLUE SHIELD | Admitting: Nurse Practitioner

## 2018-05-03 ENCOUNTER — Other Ambulatory Visit (INDEPENDENT_AMBULATORY_CARE_PROVIDER_SITE_OTHER): Payer: Self-pay

## 2018-05-03 ENCOUNTER — Other Ambulatory Visit: Payer: Self-pay | Admitting: *Deleted

## 2018-05-03 DIAGNOSIS — G40209 Localization-related (focal) (partial) symptomatic epilepsy and epileptic syndromes with complex partial seizures, not intractable, without status epilepticus: Secondary | ICD-10-CM

## 2018-05-03 DIAGNOSIS — H9190 Unspecified hearing loss, unspecified ear: Secondary | ICD-10-CM

## 2018-05-03 DIAGNOSIS — Z0289 Encounter for other administrative examinations: Secondary | ICD-10-CM

## 2018-05-03 DIAGNOSIS — Z5181 Encounter for therapeutic drug level monitoring: Secondary | ICD-10-CM

## 2018-05-04 ENCOUNTER — Institutional Professional Consult (permissible substitution): Payer: Self-pay | Admitting: Medical

## 2018-05-04 ENCOUNTER — Telehealth: Payer: Self-pay | Admitting: *Deleted

## 2018-05-04 LAB — CBC WITH DIFFERENTIAL/PLATELET
BASOS: 0 %
Basophils Absolute: 0 10*3/uL (ref 0.0–0.2)
EOS (ABSOLUTE): 0.2 10*3/uL (ref 0.0–0.4)
EOS: 4 %
HEMATOCRIT: 42.8 % (ref 37.5–51.0)
HEMOGLOBIN: 13.3 g/dL (ref 13.0–17.7)
Immature Grans (Abs): 0 10*3/uL (ref 0.0–0.1)
Immature Granulocytes: 0 %
LYMPHS ABS: 2.4 10*3/uL (ref 0.7–3.1)
Lymphs: 43 %
MCH: 27.8 pg (ref 26.6–33.0)
MCHC: 31.1 g/dL — AB (ref 31.5–35.7)
MCV: 89 fL (ref 79–97)
MONOCYTES: 7 %
MONOS ABS: 0.4 10*3/uL (ref 0.1–0.9)
NEUTROS ABS: 2.5 10*3/uL (ref 1.4–7.0)
Neutrophils: 46 %
Platelets: 176 10*3/uL (ref 150–450)
RBC: 4.79 x10E6/uL (ref 4.14–5.80)
RDW: 13.2 % (ref 12.3–15.4)
WBC: 5.4 10*3/uL (ref 3.4–10.8)

## 2018-05-04 LAB — COMPREHENSIVE METABOLIC PANEL
ALBUMIN: 4.7 g/dL (ref 3.5–5.5)
ALT: 29 IU/L (ref 0–44)
AST: 30 IU/L (ref 0–40)
Albumin/Globulin Ratio: 1.9 (ref 1.2–2.2)
Alkaline Phosphatase: 52 IU/L (ref 39–117)
BUN / CREAT RATIO: 15 (ref 9–20)
BUN: 17 mg/dL (ref 6–20)
Bilirubin Total: 0.2 mg/dL (ref 0.0–1.2)
CO2: 26 mmol/L (ref 20–29)
CREATININE: 1.14 mg/dL (ref 0.76–1.27)
Calcium: 9.2 mg/dL (ref 8.7–10.2)
Chloride: 104 mmol/L (ref 96–106)
GFR calc non Af Amer: 81 mL/min/{1.73_m2} (ref >59)
GFR, EST AFRICAN AMERICAN: 93 mL/min/{1.73_m2} (ref >59)
GLUCOSE: 87 mg/dL (ref 65–99)
Globulin, Total: 2.5 g/dL (ref 1.5–4.5)
Potassium: 5 mmol/L (ref 3.5–5.2)
SODIUM: 146 mmol/L — AB (ref 134–144)
TOTAL PROTEIN: 7.2 g/dL (ref 6.0–8.5)

## 2018-05-04 LAB — CARBAMAZEPINE LEVEL, TOTAL: Carbamazepine (Tegretol), S: 7.8 ug/mL (ref 4.0–12.0)

## 2018-05-04 NOTE — Telephone Encounter (Signed)
Unable to reach patient on second attempt

## 2018-05-04 NOTE — Telephone Encounter (Signed)
Through deaf interpreter, the patient did not answer and this RN was unable to LVM. Will call back later.

## 2018-05-05 NOTE — Telephone Encounter (Signed)
Through deaf interpreter service, was able to LVM informing patient his lab results are stable. Left number and advised he call back for any questions.

## 2018-06-21 ENCOUNTER — Institutional Professional Consult (permissible substitution): Payer: BLUE CROSS/BLUE SHIELD | Admitting: Medical

## 2018-09-28 ENCOUNTER — Institutional Professional Consult (permissible substitution): Payer: BLUE CROSS/BLUE SHIELD | Admitting: Medical

## 2018-09-29 ENCOUNTER — Ambulatory Visit (INDEPENDENT_AMBULATORY_CARE_PROVIDER_SITE_OTHER): Payer: BLUE CROSS/BLUE SHIELD | Admitting: Medical

## 2018-09-29 VITALS — BP 120/80 | HR 63 | Temp 98.4°F | Resp 16 | Ht 69.0 in | Wt 143.4 lb

## 2018-09-29 DIAGNOSIS — Z113 Encounter for screening for infections with a predominantly sexual mode of transmission: Secondary | ICD-10-CM

## 2018-09-29 DIAGNOSIS — Z23 Encounter for immunization: Secondary | ICD-10-CM | POA: Diagnosis not present

## 2018-09-29 DIAGNOSIS — H9190 Unspecified hearing loss, unspecified ear: Secondary | ICD-10-CM

## 2018-09-29 DIAGNOSIS — Z7189 Other specified counseling: Secondary | ICD-10-CM | POA: Diagnosis not present

## 2018-09-29 DIAGNOSIS — G40209 Localization-related (focal) (partial) symptomatic epilepsy and epileptic syndromes with complex partial seizures, not intractable, without status epilepticus: Secondary | ICD-10-CM | POA: Diagnosis not present

## 2018-09-29 DIAGNOSIS — Z7184 Encounter for health counseling related to travel: Secondary | ICD-10-CM | POA: Diagnosis not present

## 2018-09-29 DIAGNOSIS — Z139 Encounter for screening, unspecified: Secondary | ICD-10-CM

## 2018-09-29 DIAGNOSIS — Z7185 Encounter for immunization safety counseling: Secondary | ICD-10-CM

## 2018-09-29 NOTE — Patient Instructions (Signed)
I hope you enjoy your trip  We updated your mumps, measles, rubella vaccine today We updated your flu shot today We updated your hepatitis A vaccine today You are already up-to-date on tetanus vaccine which includes diphtheria  Be safe in your travels, think about safety first when walking around the streets at night  Use condoms  Plan to do an STD check about 3 months that you get back however if you have any symptoms in the meantime, then come in for visit   Avoid touching dogs or animals as there have been reports of rabies transmission through animal bites in [place] and [place] .  Rabies infection is usually fatal

## 2018-09-29 NOTE — Progress Notes (Signed)
Subjective: Chief Complaint  Patient presents with  . vaccines    vaccine travel   Here today for upcoming travel.   Interpreter present for sign language.   We relied on writing questions and answers on paper to communicate.   He is traveling from [place] on a cruise ship to [place] in [place] from November 6 through November 10.  His parents are celebrating 34 years of marriage.  He apparently has a girlfriend he is planning to see there, and there could be sexual activity.  He plans to use condoms.  He denies concerns about any other blood or bodily fluid exposure.  This will only be in tourist areas and the cruise ship.  No rural excursions or plans to be around animals.  Otherwise has been in usual state of health, feels healthy, no other complaint.  He is compliant with carbamazepine for seizure disorder.  He has a history significant for seizure disorder cerebral palsy and deaf.    He has no other concerns.  Past Medical History:  Diagnosis Date  . 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 Cerebral palsy ([place])   . Deaf   . Seizures ([place])    Current Outpatient Medications on File Prior to Visit  Medication Sig Dispense Refill  . carbamazepine (EPITOL) 200 MG tablet Take 1 tablet (200 mg total) by mouth 3 (three) times daily. 90 tablet 11   No current facility-administered medications on file prior to visit.    ROS as in subjective  Objective: BP 120/80   Pulse 63   Temp 98.4 F (36.9 C) (Tympanic)   Resp 16   Ht '5\' 9"'  (1.753 m)   Wt 143 lb 6.4 oz (65 kg)   SpO2 98%   BMI 21.18 kg/m   General appearance: alert, no distress, WD/WN,  HEENT: normocephalic, sclerae anicteric, TMs pearly, nares patent, no discharge or erythema, pharynx normal Oral cavity: MMM, no lesions Neck: supple, no lymphadenopathy, no thyromegaly, no masses Heart: RRR, normal S1, S2, no murmurs Lungs: CTA bilaterally, no wheezes, rhonchi, or rales Pulses: 2+ symmetric, upper and lower  extremities, normal cap refill    Assessment: Encounter Diagnoses  Name Primary?  . Travel advice encounter Yes  . Vaccine counseling   . Deafness, unspecified laterality   . Partial epilepsy with impairment of consciousness   . Screen for STD (sexually transmitted disease)   . Need for influenza vaccination   . Need for hepatitis A immunization   . Need for MMR vaccine   . Screening for condition      Plan: Discussed his concerns, recommendations as below.  Counseled on the MMR vaccine.  Vaccine information sheet given. MMR vaccine given after consent obtained.  Counseled on the influenza virus vaccine.  Vaccine information sheet given.  Influenza vaccine given after consent obtained.  Counseled on the Hepatitis A virus vaccine.  Vaccine information sheet given.  Hepatitis A vaccine given after consent obtained.    Patient Instructions  I hope you enjoy your trip  We updated your mumps, measles, rubella vaccine today We updated your flu shot today We updated your hepatitis A vaccine today You are already up-to-date on tetanus vaccine which includes diphtheria  Be safe in your travels, think about safety first when walking around the streets at night  Use condoms  Plan to do an STD check about 3 months that you get back however if you have any symptoms in the meantime, then come in for visit  Avoid touching dogs or animals as there have been reports of rabies transmission through animal bites in [place] and [place] .  Rabies infection is usually fatal      Mitchell Jensen was seen today for vaccines.  Diagnoses and all orders for this visit:  Travel advice encounter -     Varicella zoster antibody, IgG -     Poliovirus antibodies, types 1, 2, and 3  Vaccine counseling -     Varicella zoster antibody, IgG -     Poliovirus antibodies, types 1, 2, and 3  Deafness, unspecified laterality  Partial epilepsy with impairment of consciousness  Screen for  STD (sexually transmitted disease) -     HIV Antibody (routine testing w rflx) -     GC/Chlamydia Probe Amp -     RPR  Need for influenza vaccination -     Flu Vaccine QUAD 6+ mos PF IM (Fluarix Quad PF)  Need for hepatitis A immunization -     Hepatitis A vaccine adult IM  Need for MMR vaccine -     MMR vaccine subcutaneous  Screening for condition -     Varicella zoster antibody, IgG -     Poliovirus antibodies, types 1, 2, and 3

## 2018-09-30 LAB — GC/CHLAMYDIA PROBE AMP
CHLAMYDIA, DNA PROBE: NEGATIVE
Neisseria gonorrhoeae by PCR: NEGATIVE

## 2018-10-07 LAB — RPR: RPR: NONREACTIVE

## 2018-10-07 LAB — POLIOVIRUS ANTIBODIES, TYPES 1, 2, AND 3: Poliovirus Ab Type 1: >=1:8 {titer}

## 2018-10-07 LAB — HIV ANTIBODY (ROUTINE TESTING W REFLEX): HIV Screen 4th Generation wRfx: NONREACTIVE

## 2018-10-07 LAB — VARICELLA ZOSTER ANTIBODY, IGG: VARICELLA: 2425 {index} (ref >165)

## 2018-11-24 ENCOUNTER — Encounter: Payer: Self-pay | Admitting: Nurse Practitioner

## 2018-12-30 ENCOUNTER — Encounter: Payer: Self-pay | Admitting: Medical

## 2018-12-30 ENCOUNTER — Ambulatory Visit (INDEPENDENT_AMBULATORY_CARE_PROVIDER_SITE_OTHER): Payer: BLUE CROSS/BLUE SHIELD | Admitting: Medical

## 2018-12-30 VITALS — BP 130/80 | HR 61 | Temp 97.7°F | Resp 16 | Ht 69.0 in | Wt 156.2 lb

## 2018-12-30 DIAGNOSIS — Z973 Presence of spectacles and contact lenses: Secondary | ICD-10-CM | POA: Insufficient documentation

## 2018-12-30 DIAGNOSIS — G40209 Localization-related (focal) (partial) symptomatic epilepsy and epileptic syndromes with complex partial seizures, not intractable, without status epilepticus: Secondary | ICD-10-CM

## 2018-12-30 DIAGNOSIS — H9190 Unspecified hearing loss, unspecified ear: Secondary | ICD-10-CM

## 2018-12-30 DIAGNOSIS — Z79899 Other long term (current) drug therapy: Secondary | ICD-10-CM | POA: Insufficient documentation

## 2018-12-30 DIAGNOSIS — G8191 Hemiplegia, unspecified affecting right dominant side: Secondary | ICD-10-CM | POA: Insufficient documentation

## 2018-12-30 DIAGNOSIS — G809 Cerebral palsy, unspecified: Secondary | ICD-10-CM

## 2018-12-30 DIAGNOSIS — R269 Unspecified abnormalities of gait and mobility: Secondary | ICD-10-CM | POA: Insufficient documentation

## 2018-12-30 DIAGNOSIS — Z7184 Encounter for health counseling related to travel: Secondary | ICD-10-CM | POA: Diagnosis not present

## 2018-12-30 NOTE — Patient Instructions (Addendum)
   [employer] [employer] [address] [place], [place] [address] Phone: [phone]
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
## 2019-02-20 NOTE — Progress Notes (Signed)
[facility]  PATIENT: Mitchell Jensen DOB: 1976-06-19   REASON FOR VISIT: Follow-up for epilepsy, patient is deaf, gait abnormality HISTORY FROM: Interpreter    HISTORY OF PRESENT ILLNESS:3/17/2020CMMr [name], 43 year old returns for follow up for seizure disorder, he has been well-controlled on carbamazepine with last seizure occurring in 1997. He has changed to working the night shift And taking his carbamazepine prior to going to work makes him too sleepy.  He is currently taking his medication 3 times a day he can cut that down to 2 times a day taking 1-1/2 tablets instead of 1 tablet 3 times.  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 He is deaf and has an interpreter for signing purposes He denies headache, bowel or bladder incontinence, staring spells.He denies that he has any difficulty sleeping. He had back surgery 09/23/2016 by Dr. Yetta Barre. He had sustained a work injury.  He is back to work full-time.    He returns for reevaluation  HISTORY: of complex partial seizure disorder. He has been followed by our practice since December of 1997. He has history of spells inconsistent staring that represents his complex partial seizure disorder. He is the product of a complicated second pregnancy and was born prematurely. 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 He is currently on carbamazepine 200 mg 3 times daily without side effects. He denies any feelings of being off balance or falls Date of last seizure 1997. No staring spells, confusion, sleep disturbances, lapses of time, headache  and bowel and bladder incontinence.     REVIEW OF SYSTEMS: Full 14 system review of systems performed and notable only for those listed, all others are neg:  Constitutional: neg  Cardiovascular: neg Ear/Nose/Throat: neg  Skin: neg Eyes: neg Respiratory: neg Gastroitestinal: neg  Hematology/Lymphatic: neg  Endocrine: neg Musculoskeletal: Back surgery in October 2017 , gait disorder Allergy/Immunology: neg Neurological: History of seizure disorder Psychiatric: neg Sleep : neg   ALLERGIES: No Known Allergies  HOME MEDICATIONS: Outpatient Medications Prior to Visit  Medication Sig Dispense Refill  . carbamazepine (EPITOL) 200 MG tablet Take 1 tablet (200 mg total) by mouth 3 (three) times daily. 90 tablet 11   No facility-administered medications prior to visit.     PAST MEDICAL HISTORY: Past Medical History:  Diagnosis Date  . 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 Cerebral palsy (HCC)   . Deaf   . Seizures (HCC)     PAST SURGICAL HISTORY: Past Surgical History:  Procedure Laterality Date  . BACK SURGERY  09/23/2016   Dr Yetta Barre, lumbar -"removed damaged area"    FAMILY HISTORY: Family History  Problem Relation Age of Onset  . Heart attack Father   . COPD Father     SOCIAL HISTORY: Social History   Socioeconomic History  . Marital status: Single    Spouse name: Not on file  . Number of children: 0  . Years of education: HS  . Highest education level: Not on file  Occupational History  . Occupation: Holiday representative: [employer]  Social Needs  . Financial  resource strain: Not on file  . Food insecurity:    Worry: Not on file    Inability: Not on file  . Transportation needs:    Medical: Not on file    Non-medical: Not on file  Tobacco Use  . Smoking status: Never Smoker  . Smokeless tobacco: Never Used  Substance and Sexual Activity  . Alcohol use: No  . Drug use: No  . Sexual activity: Not on file  Lifestyle  . Physical activity:    Days per  week: Not on file    Minutes per session: Not on file  . Stress: Not on file  Relationships  . Social connections:    Talks on phone: Not on file    Gets together: Not on file    Attends religious service: Not on file    Active member of club or organization: Not on file    Attends meetings of clubs or organizations: Not on file    Relationship status: Not on file  . Intimate partner violence:    Fear of current or ex partner: Not on file    Emotionally abused: Not on file    Physically abused: Not on file    Forced sexual activity: Not on file  Other Topics Concern  . 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   Patient lives at home alone.    Patient has a HS.    Patient is left handed.    Patient drinks caffeine every once in a while. And sometimes if he drinks caffeine while working.       PHYSICAL EXAM  Vitals:   02/21/19 1024  BP: 134/88  Pulse: 70  Weight: 150 lb 3.2 oz (68.1 kg)  Height: 5\' 9"  (1.753 m)   Body mass index is 22.18 kg/m. Generalized: Well developed, in no acute distress well groomed,  Head: normocephalic and atraumatic,. Oropharynx benign  Neck: Supple, Neurological examination   Mentation: Alert. Follows all commands given by an interpreter that signs, patient is deaf.   Cranial nerve II-XII: Pupils were equal round reactive to light extraocular movements were full, visual field were full on confrontational test. Facial sensation and strength were normal. Patient is DEAF. Uvula tongue midline. head turning and shoulder shrug were normal and symmetric.Tongue protrusion into cheek strength was normal. Motor: normal bulk and tone, full strength in the BUE, BLE, mild decreased right grip strength.4/5 RLE  Sensory: normal and symmetric to light touch, in the upper and lower extremities Coordination: finger-nose-finger, heel-to-shin bilaterally, no  dysmetria Reflexes: Increased on the right as compared to the left plantar responses were flexor bilaterally. Gait and Station: Ambulated a short distance in the hall. Hemiparetic gait on the right without an assistive device unsteady with tandem. DIAGNOSTIC DATA (LABS, IMAGING, TESTING) - I reviewed patient records, labs, notes, testing and imaging myself where available.  Lab Results  Component Value Date   WBC 5.4 05/03/2018   HGB 13.3 05/03/2018   HCT 42.8 05/03/2018   MCV 89 05/03/2018   PLT 176 05/03/2018      Component Value Date/Time   NA 146 (H) 05/03/2018 0846   K 5.0 05/03/2018 0846   CL 104 05/03/2018 0846   CO2 26 05/03/2018 0846   GLUCOSE 87 05/03/2018 0846   GLUCOSE 95 03/06/2015 0441   BUN 17 05/03/2018 0846   CREATININE 1.14 05/03/2018 0846  CALCIUM 9.2 05/03/2018 0846   PROT 7.2 05/03/2018 0846   ALBUMIN 4.7 05/03/2018 0846   AST 30 05/03/2018 0846   ALT 29 05/03/2018 0846   ALKPHOS 52 05/03/2018 0846   BILITOT 0.2 05/03/2018 0846   GFRNONAA 81 05/03/2018 0846   GFRAA 93 05/03/2018 0846    ASSESSMENT AND PLAN 43 y.o. 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 here to follow-up.Last seizure event occurred in 1997.    PLAN: CBC, CMP to monitor adverse effects carbamazepine   carbamazepine level to monitor for therapeutic level toxicity Due to drowsiness change carbamazepine (Epitol ) to 1.5 twice daily will refill when labs are back, it continued drowsiness may switch to extended release. Call for any seizure activity Follow-up yearly and when necessary  I spent 20 minutes in total face to face time with the patient more than 50% of which was spent counseling and coordination of care, reviewing test results reviewing medications and discussing and reviewing the diagnosis of seizure disorder.  Discussed increasing his medication to 1-1/2 twice daily instead of  1 tab 3 times  daily to see if that will help with his drowsiness.  He is now working third shift. Nilda Riggs, [facility], [facility], APRN  [facility] [address] [place], [place] [address] [phone]

## 2019-02-21 ENCOUNTER — Encounter: Payer: Self-pay | Admitting: Nurse Practitioner

## 2019-02-21 ENCOUNTER — Ambulatory Visit: Payer: BLUE CROSS/BLUE SHIELD | Admitting: Nurse Practitioner

## 2019-02-21 ENCOUNTER — Other Ambulatory Visit: Payer: Self-pay

## 2019-02-21 VITALS — BP 134/88 | HR 70 | Ht 69.0 in | Wt 150.2 lb

## 2019-02-21 DIAGNOSIS — R269 Unspecified abnormalities of gait and mobility: Secondary | ICD-10-CM | POA: Diagnosis not present

## 2019-02-21 DIAGNOSIS — G809 Cerebral palsy, unspecified: Secondary | ICD-10-CM

## 2019-02-21 DIAGNOSIS — Z5181 Encounter for therapeutic drug level monitoring: Secondary | ICD-10-CM

## 2019-02-21 DIAGNOSIS — G40209 Localization-related (focal) (partial) symptomatic epilepsy and epileptic syndromes with complex partial seizures, not intractable, without status epilepticus: Secondary | ICD-10-CM | POA: Diagnosis not present

## 2019-02-21 NOTE — Patient Instructions (Signed)
CBC, CMP to monitor adverse effects carbamazepine   carbamazepine level to monitor for therapeutic level toxicity Due to drowsiness change carbamazepine (Epitol ) to 1.5 twice daily will refill when labs are back Call for any seizure activity Follow-up yearly and when necessary

## 2019-02-21 NOTE — Progress Notes (Signed)
I have read the note, and I agree with the clinical assessment and plan.  Korby Ratay K Stellan Vick   

## 2019-02-22 ENCOUNTER — Other Ambulatory Visit: Payer: Self-pay | Admitting: Nurse Practitioner

## 2019-02-22 ENCOUNTER — Telehealth: Payer: Self-pay | Admitting: *Deleted

## 2019-02-22 LAB — COMPREHENSIVE METABOLIC PANEL
ALT: 31 IU/L (ref 0–44)
AST: 31 IU/L (ref 0–40)
Albumin/Globulin Ratio: 1.8 (ref 1.2–2.2)
Albumin: 5.1 g/dL — ABNORMAL HIGH (ref 4.0–5.0)
Alkaline Phosphatase: 51 IU/L (ref 39–117)
BUN/Creatinine Ratio: 21 — ABNORMAL HIGH (ref 9–20)
BUN: 27 mg/dL — ABNORMAL HIGH (ref 6–24)
Bilirubin Total: 0.4 mg/dL (ref 0.0–1.2)
CO2: 23 mmol/L (ref 20–29)
Calcium: 9.7 mg/dL (ref 8.7–10.2)
Chloride: 101 mmol/L (ref 96–106)
Creatinine, Ser: 1.31 mg/dL — ABNORMAL HIGH (ref 0.76–1.27)
GFR calc Af Amer: 78 mL/min/{1.73_m2} (ref >59)
GFR calc non Af Amer: 68 mL/min/{1.73_m2} (ref >59)
GLOBULIN, TOTAL: 2.9 g/dL (ref 1.5–4.5)
Glucose: 110 mg/dL — ABNORMAL HIGH (ref 65–99)
Potassium: 4.2 mmol/L (ref 3.5–5.2)
Sodium: 142 mmol/L (ref 134–144)
Total Protein: 8 g/dL (ref 6.0–8.5)

## 2019-02-22 LAB — CBC WITH DIFFERENTIAL/PLATELET
Basophils Absolute: 0.1 10*3/uL (ref 0.0–0.2)
Basos: 1 %
EOS (ABSOLUTE): 0.1 10*3/uL (ref 0.0–0.4)
Eos: 2 %
HEMATOCRIT: 42.3 % (ref 37.5–51.0)
HEMOGLOBIN: 14.3 g/dL (ref 13.0–17.7)
Immature Grans (Abs): 0 10*3/uL (ref 0.0–0.1)
Immature Granulocytes: 0 %
Lymphocytes Absolute: 3 10*3/uL (ref 0.7–3.1)
Lymphs: 56 %
MCH: 28.3 pg (ref 26.6–33.0)
MCHC: 33.8 g/dL (ref 31.5–35.7)
MCV: 84 fL (ref 79–97)
MONOCYTES: 6 %
Monocytes Absolute: 0.3 10*3/uL (ref 0.1–0.9)
Neutrophils Absolute: 1.9 10*3/uL (ref 1.4–7.0)
Neutrophils: 35 %
Platelets: 217 10*3/uL (ref 150–450)
RBC: 5.05 x10E6/uL (ref 4.14–5.80)
RDW: 11.8 % (ref 11.6–15.4)
WBC: 5.3 10*3/uL (ref 3.4–10.8)

## 2019-02-22 LAB — CARBAMAZEPINE LEVEL, TOTAL: Carbamazepine (Tegretol), S: 7.3 ug/mL (ref 4.0–12.0)

## 2019-02-22 MED ORDER — CARBAMAZEPINE 200 MG PO TABS: 300.0000 mg | ORAL_TABLET | Freq: Two times a day (BID) | ORAL | 11 refills | Status: DC

## 2019-02-22 NOTE — Telephone Encounter (Signed)
-----   Message from Nilda Riggs, NP sent at 02/22/2019  8:36 AM EDT ----- Labs are okay except kidney function mildly elevated please make sure you are drinking plenty of water.  I will refill your meds remember to take 1-1/2 tabs twice daily

## 2019-02-22 NOTE — Telephone Encounter (Signed)
I relayed thru service, (since pt deaf) that his lab results were ok, exception mild elevated kidney function, be sure to drink plenty of water.  Refills done, remember to take 1.5 tablets po bid.  Call back if questions. Relay successful.

## 2019-02-28 ENCOUNTER — Institutional Professional Consult (permissible substitution): Payer: BLUE CROSS/BLUE SHIELD | Admitting: Medical

## 2019-04-04 ENCOUNTER — Ambulatory Visit: Payer: BLUE CROSS/BLUE SHIELD | Admitting: Nurse Practitioner

## 2019-06-19 ENCOUNTER — Telehealth: Payer: Self-pay | Admitting: Neurology

## 2019-06-19 NOTE — Telephone Encounter (Signed)
I reached out to the pt via sign language interpreter service. Need to verify what type of DMV form is needed? If pt has form from [facility] office he can mail or drop the paper work off.

## 2019-06-19 NOTE — Telephone Encounter (Signed)
Patient called back and stated it was for a Handicap form. He states that it was mailed and we should get it tomorrow or Wednesday.

## 2019-06-19 NOTE — Telephone Encounter (Signed)
Noted, will wait for form to arrive.

## 2019-06-19 NOTE — Telephone Encounter (Signed)
Pt called stating that he is needing a new form filled out for the DMV. Pt updated his new address and phone number. He added a 336 number that is only for texting and the other number is for an interpreter.

## 2019-06-21 NOTE — Telephone Encounter (Signed)
Pt called in and would like to know if forms were received

## 2019-06-21 NOTE — Telephone Encounter (Signed)
I reached out to the pt via sign language interpreter and advised I have not yet received but we are watching for the form. Pt verbalized understanding. Pt prefers for form to be mailed once completed.

## 2019-06-22 NOTE — Telephone Encounter (Signed)
I reached out to the pt via sign language interpreter. I advised forms have been received and will be mailed the first part of next week. Pt verbalized understanding.

## 2019-06-22 NOTE — Telephone Encounter (Signed)
Pt would like a call back when forms are received

## 2019-06-27 NOTE — Telephone Encounter (Addendum)
Handicap placard has been placed in out going mail at [facility] to the pt.

## 2020-02-15 ENCOUNTER — Telehealth: Payer: Self-pay | Admitting: *Deleted

## 2020-02-15 NOTE — Telephone Encounter (Signed)
I called pt not able to leave a voice mail. Mailed the pt a release r/c a return to sender letter.

## 2020-02-21 ENCOUNTER — Telehealth: Payer: Self-pay | Admitting: *Deleted

## 2020-02-21 NOTE — Telephone Encounter (Signed)
Pt called left [facility] # [id number] claim # [id number]

## 2020-02-27 ENCOUNTER — Telehealth: Payer: Self-pay | Admitting: Neurology

## 2020-02-27 ENCOUNTER — Ambulatory Visit: Payer: BLUE CROSS/BLUE SHIELD | Admitting: Neurology

## 2020-02-27 NOTE — Telephone Encounter (Signed)
Patient recently move to [facility] state and wanted a referral to new MD. I call the sign language line and relayed message pt has not been seen since 2020 and he will need to call his current PCP to establish care in  [place] state.This was relayed to pt via sign language service.

## 2020-02-27 NOTE — Telephone Encounter (Signed)
Pt called stating he has moved out of state and is needing a   referral sent over to   Dr.neevera at [address] [place] [address] phone#[phone]

## 2020-02-28 ENCOUNTER — Telehealth: Payer: Self-pay | Admitting: Medical

## 2020-02-28 NOTE — Telephone Encounter (Signed)
Pt called and needs new pt referral to be a new pt pt has moved the dr office [facility] medical office  Building  [address] [place], [place], [place] [address] pt can be reached at [phone] and pt would like to be called after the referral has been done

## 2020-02-29 NOTE — Telephone Encounter (Signed)
See Shauna's message.    He needs referral to new PCP in another state.

## 2020-02-29 NOTE — Telephone Encounter (Signed)
I do not understand the message.  If he moved and is trying to find a primary care provider, he should not need a specific referral.  We can send our records if he is transferring care.

## 2020-02-29 NOTE — Telephone Encounter (Signed)
That's what I told the pt he tried also, that he should not need a referral as a new pt, but he said they would not see him with out a referral, which seems very odd to me,

## 2020-03-05 NOTE — Telephone Encounter (Signed)
Is this something we do?

## 2020-03-06 NOTE — Telephone Encounter (Signed)
Patient stated he thought he needed a referral to see a new pcp. Patient was informed he could choose a pcp he wanted and he said he will call one. He said he was not trying to request medical records.

## 2020-03-06 NOTE — Telephone Encounter (Signed)
Patients do not normally need a referral to a new PCP in another state.  The PCP might require patients records before agreeing to see him.  That may be the need, please inquire?

## 2020-03-06 NOTE — Telephone Encounter (Signed)
[facility] req

## 2020-03-06 NOTE — Telephone Encounter (Signed)
Tried reaching out to patient and interpreter stated that there was no answer. So either there is a software problem or the device is turned off.

## 2020-03-07 ENCOUNTER — Telehealth: Payer: Self-pay | Admitting: Diagnostic Neuroimaging

## 2020-03-07 ENCOUNTER — Telehealth: Payer: Self-pay | Admitting: Neurology

## 2020-03-07 MED ORDER — CARBAMAZEPINE 200 MG PO TABS: 300.0000 mg | ORAL_TABLET | Freq: Two times a day (BID) | ORAL | 1 refills | Status: DC

## 2020-03-07 NOTE — Telephone Encounter (Signed)
PT move to [place] last month per last phone note.

## 2020-03-07 NOTE — Telephone Encounter (Signed)
1) Medication(s) Requested (by name): carbamazepine (EPITOL) 200 MG tablet  2) Pharmacy of Choice: [phone] Fax#[phone]   Pharmacy called requesting prescription be sent over to them

## 2020-03-07 NOTE — Telephone Encounter (Signed)
Meds ordered this encounter  Medications  . carbamazepine (EPITOL) 200 MG tablet    Sig: Take 1.5 tablets (300 mg total) by mouth 2 (two) times daily.    Dispense:  90 tablet    Refill:  1   Called by pharmacy in [place] state. Patient has moved there and needs refill of seizure med. Will give 1 month rx with 1 refill until he can establish locally.    Suanne Marker, MD 03/07/2020, 6:54 PM Certified in Neurology, Neurophysiology and Neuroimaging  [facility] [address] [place], [place] [address] [phone]

## 2020-03-07 NOTE — Telephone Encounter (Signed)
I called the interpreter line to relay message about need ing refill on epitol. I stated pt move to [facility] [place] and was last seen 02/2019. He will need to seek ED or urgent care to get ongoing refills or call his PCP in [place]. She stated pt verbalized understanding.

## 2020-07-23 ENCOUNTER — Other Ambulatory Visit: Payer: Self-pay | Admitting: Diagnostic Neuroimaging

## 2020-08-13 ENCOUNTER — Telehealth: Payer: Self-pay | Admitting: Neurology

## 2020-08-13 MED ORDER — CARBAMAZEPINE 200 MG PO TABS: 300.0000 mg | ORAL_TABLET | Freq: Two times a day (BID) | ORAL | 0 refills | Status: AC

## 2020-08-13 NOTE — Telephone Encounter (Signed)
Pt's interpreter relayed Pt request refill carbamazepine (EPITOL) 200 MG tablet at [facility]. Pt stated, looking for a new PCP, will call you with new PCP.

## 2020-08-13 NOTE — Telephone Encounter (Signed)
1-month supply sent.

## 2020-08-13 NOTE — Telephone Encounter (Signed)
Noted, once pt calls back with new PCP information we will update in the system.
